# Patient Record
Sex: Female | Born: 1945 | Race: White | Hispanic: No | Smoking: Never smoker
Health system: Southern US, Community
[De-identification: ages and names within clinical notes are randomized; demographics above are authoritative.]

## PROBLEM LIST (undated history)

## (undated) DIAGNOSIS — N189 Chronic kidney disease, unspecified: Secondary | ICD-10-CM

## (undated) DIAGNOSIS — I10 Essential (primary) hypertension: Secondary | ICD-10-CM

## (undated) DIAGNOSIS — D649 Anemia, unspecified: Secondary | ICD-10-CM

## (undated) DIAGNOSIS — M1990 Unspecified osteoarthritis, unspecified site: Secondary | ICD-10-CM

## (undated) DIAGNOSIS — R002 Palpitations: Secondary | ICD-10-CM

## (undated) DIAGNOSIS — E7800 Pure hypercholesterolemia, unspecified: Secondary | ICD-10-CM

## (undated) DIAGNOSIS — E785 Hyperlipidemia, unspecified: Secondary | ICD-10-CM

## (undated) DIAGNOSIS — F419 Anxiety disorder, unspecified: Secondary | ICD-10-CM

## (undated) DIAGNOSIS — M722 Plantar fascial fibromatosis: Secondary | ICD-10-CM

## (undated) HISTORY — PX: APPENDECTOMY: SHX54

## (undated) HISTORY — DX: Pure hypercholesterolemia, unspecified: E78.00

## (undated) HISTORY — DX: Chronic kidney disease, unspecified: N18.9

## (undated) HISTORY — PX: CHOLECYSTECTOMY: SHX55

## (undated) HISTORY — PX: COLONOSCOPY: SHX174

## (undated) HISTORY — PX: ROTATOR CUFF REPAIR: SHX139

## (undated) HISTORY — DX: Plantar fascial fibromatosis: M72.2

## (undated) HISTORY — PX: ABDOMINAL HYSTERECTOMY: SHX81

## (undated) HISTORY — PX: CATARACT EXTRACTION, BILATERAL: SHX1313

## (undated) HISTORY — DX: Palpitations: R00.2

## (undated) HISTORY — PX: KNEE ARTHROSCOPY W/ MENISCECTOMY: SHX1879

## (undated) HISTORY — PX: TONSILLECTOMY: SUR1361

## (undated) HISTORY — PX: ROBOTIC ASSISTED SUPRACERVICAL HYSTERECTOMY WITH BILATERAL SALPINGO OOPHERECTOMY: SHX6084

## (undated) HISTORY — PX: NASAL SEPTUM SURGERY: SHX37

---

## 1998-04-19 ENCOUNTER — Encounter: Payer: Self-pay | Admitting: Family Medicine

## 1998-04-19 ENCOUNTER — Ambulatory Visit (HOSPITAL_COMMUNITY): Admission: RE | Admit: 1998-04-19 | Discharge: 1998-04-19 | Payer: Self-pay | Admitting: Family Medicine

## 1999-08-17 ENCOUNTER — Ambulatory Visit (HOSPITAL_COMMUNITY): Admission: RE | Admit: 1999-08-17 | Discharge: 1999-08-17 | Payer: Self-pay | Admitting: Family Medicine

## 1999-08-17 ENCOUNTER — Encounter: Payer: Self-pay | Admitting: Family Medicine

## 2000-11-25 ENCOUNTER — Encounter: Payer: Self-pay | Admitting: Family Medicine

## 2000-11-25 ENCOUNTER — Ambulatory Visit (HOSPITAL_COMMUNITY): Admission: RE | Admit: 2000-11-25 | Discharge: 2000-11-25 | Payer: Self-pay | Admitting: Family Medicine

## 2002-12-24 ENCOUNTER — Ambulatory Visit (HOSPITAL_COMMUNITY): Admission: RE | Admit: 2002-12-24 | Discharge: 2002-12-24 | Payer: Self-pay | Admitting: Family Medicine

## 2002-12-24 ENCOUNTER — Encounter: Payer: Self-pay | Admitting: Family Medicine

## 2002-12-31 ENCOUNTER — Encounter: Admission: RE | Admit: 2002-12-31 | Discharge: 2002-12-31 | Payer: Self-pay | Admitting: Family Medicine

## 2002-12-31 ENCOUNTER — Encounter: Payer: Self-pay | Admitting: Family Medicine

## 2004-01-23 ENCOUNTER — Other Ambulatory Visit: Admission: RE | Admit: 2004-01-23 | Discharge: 2004-01-23 | Payer: Self-pay | Admitting: Family Medicine

## 2007-11-23 ENCOUNTER — Ambulatory Visit (HOSPITAL_COMMUNITY): Admission: RE | Admit: 2007-11-23 | Discharge: 2007-11-23 | Payer: Self-pay | Admitting: Family Medicine

## 2008-07-22 ENCOUNTER — Other Ambulatory Visit: Admission: RE | Admit: 2008-07-22 | Discharge: 2008-07-22 | Payer: Self-pay | Admitting: Family Medicine

## 2009-08-01 ENCOUNTER — Ambulatory Visit (HOSPITAL_COMMUNITY): Admission: RE | Admit: 2009-08-01 | Discharge: 2009-08-01 | Payer: Self-pay | Admitting: Family Medicine

## 2009-08-07 ENCOUNTER — Encounter: Admission: RE | Admit: 2009-08-07 | Discharge: 2009-08-07 | Payer: Self-pay | Admitting: Family Medicine

## 2010-06-03 ENCOUNTER — Encounter: Payer: Self-pay | Admitting: Family Medicine

## 2011-05-15 DIAGNOSIS — H2510 Age-related nuclear cataract, unspecified eye: Secondary | ICD-10-CM | POA: Diagnosis not present

## 2011-05-15 DIAGNOSIS — H40029 Open angle with borderline findings, high risk, unspecified eye: Secondary | ICD-10-CM | POA: Diagnosis not present

## 2011-05-15 DIAGNOSIS — H524 Presbyopia: Secondary | ICD-10-CM | POA: Diagnosis not present

## 2011-06-11 ENCOUNTER — Other Ambulatory Visit: Payer: Self-pay | Admitting: Family Medicine

## 2011-06-11 DIAGNOSIS — N644 Mastodynia: Secondary | ICD-10-CM

## 2011-06-19 ENCOUNTER — Ambulatory Visit
Admission: RE | Admit: 2011-06-19 | Discharge: 2011-06-19 | Disposition: A | Discharge status: Home / Self Care | Payer: Medicare Other | Source: Ambulatory Visit | Attending: Family Medicine | Admitting: Family Medicine

## 2011-06-19 DIAGNOSIS — R928 Other abnormal and inconclusive findings on diagnostic imaging of breast: Secondary | ICD-10-CM | POA: Diagnosis not present

## 2011-06-19 DIAGNOSIS — N644 Mastodynia: Secondary | ICD-10-CM

## 2011-07-17 DIAGNOSIS — N951 Menopausal and female climacteric states: Secondary | ICD-10-CM | POA: Diagnosis not present

## 2011-07-17 DIAGNOSIS — F411 Generalized anxiety disorder: Secondary | ICD-10-CM | POA: Diagnosis not present

## 2011-07-17 DIAGNOSIS — IMO0001 Reserved for inherently not codable concepts without codable children: Secondary | ICD-10-CM | POA: Diagnosis not present

## 2011-07-17 DIAGNOSIS — E785 Hyperlipidemia, unspecified: Secondary | ICD-10-CM | POA: Diagnosis not present

## 2011-07-17 DIAGNOSIS — E559 Vitamin D deficiency, unspecified: Secondary | ICD-10-CM | POA: Diagnosis not present

## 2011-07-17 DIAGNOSIS — I10 Essential (primary) hypertension: Secondary | ICD-10-CM | POA: Diagnosis not present

## 2012-01-21 DIAGNOSIS — Z23 Encounter for immunization: Secondary | ICD-10-CM | POA: Diagnosis not present

## 2012-01-21 DIAGNOSIS — E785 Hyperlipidemia, unspecified: Secondary | ICD-10-CM | POA: Diagnosis not present

## 2012-01-21 DIAGNOSIS — F411 Generalized anxiety disorder: Secondary | ICD-10-CM | POA: Diagnosis not present

## 2012-01-21 DIAGNOSIS — R21 Rash and other nonspecific skin eruption: Secondary | ICD-10-CM | POA: Diagnosis not present

## 2012-01-21 DIAGNOSIS — I10 Essential (primary) hypertension: Secondary | ICD-10-CM | POA: Diagnosis not present

## 2012-01-21 DIAGNOSIS — B079 Viral wart, unspecified: Secondary | ICD-10-CM | POA: Diagnosis not present

## 2012-01-21 DIAGNOSIS — Z0000 Encounter for general adult medical examination without abnormal findings: Secondary | ICD-10-CM | POA: Diagnosis not present

## 2012-01-24 DIAGNOSIS — M25519 Pain in unspecified shoulder: Secondary | ICD-10-CM | POA: Diagnosis not present

## 2012-01-31 DIAGNOSIS — M19019 Primary osteoarthritis, unspecified shoulder: Secondary | ICD-10-CM | POA: Diagnosis not present

## 2012-02-03 DIAGNOSIS — M19019 Primary osteoarthritis, unspecified shoulder: Secondary | ICD-10-CM | POA: Diagnosis not present

## 2012-02-12 DIAGNOSIS — M24119 Other articular cartilage disorders, unspecified shoulder: Secondary | ICD-10-CM | POA: Diagnosis not present

## 2012-02-12 DIAGNOSIS — S43429A Sprain of unspecified rotator cuff capsule, initial encounter: Secondary | ICD-10-CM | POA: Diagnosis not present

## 2012-02-12 DIAGNOSIS — M67919 Unspecified disorder of synovium and tendon, unspecified shoulder: Secondary | ICD-10-CM | POA: Diagnosis not present

## 2012-02-12 DIAGNOSIS — M19019 Primary osteoarthritis, unspecified shoulder: Secondary | ICD-10-CM | POA: Diagnosis not present

## 2012-02-19 DIAGNOSIS — M25519 Pain in unspecified shoulder: Secondary | ICD-10-CM | POA: Diagnosis not present

## 2012-02-26 DIAGNOSIS — M25519 Pain in unspecified shoulder: Secondary | ICD-10-CM | POA: Diagnosis not present

## 2012-02-28 DIAGNOSIS — M25519 Pain in unspecified shoulder: Secondary | ICD-10-CM | POA: Diagnosis not present

## 2012-03-03 DIAGNOSIS — M25519 Pain in unspecified shoulder: Secondary | ICD-10-CM | POA: Diagnosis not present

## 2012-03-06 DIAGNOSIS — M25579 Pain in unspecified ankle and joints of unspecified foot: Secondary | ICD-10-CM | POA: Diagnosis not present

## 2012-03-06 DIAGNOSIS — M25519 Pain in unspecified shoulder: Secondary | ICD-10-CM | POA: Diagnosis not present

## 2012-03-10 DIAGNOSIS — M25519 Pain in unspecified shoulder: Secondary | ICD-10-CM | POA: Diagnosis not present

## 2012-03-10 DIAGNOSIS — M25579 Pain in unspecified ankle and joints of unspecified foot: Secondary | ICD-10-CM | POA: Diagnosis not present

## 2012-03-13 DIAGNOSIS — M25579 Pain in unspecified ankle and joints of unspecified foot: Secondary | ICD-10-CM | POA: Diagnosis not present

## 2012-03-13 DIAGNOSIS — M25519 Pain in unspecified shoulder: Secondary | ICD-10-CM | POA: Diagnosis not present

## 2012-03-17 DIAGNOSIS — M25579 Pain in unspecified ankle and joints of unspecified foot: Secondary | ICD-10-CM | POA: Diagnosis not present

## 2012-03-17 DIAGNOSIS — M25519 Pain in unspecified shoulder: Secondary | ICD-10-CM | POA: Diagnosis not present

## 2012-03-20 DIAGNOSIS — M25519 Pain in unspecified shoulder: Secondary | ICD-10-CM | POA: Diagnosis not present

## 2012-03-20 DIAGNOSIS — M25579 Pain in unspecified ankle and joints of unspecified foot: Secondary | ICD-10-CM | POA: Diagnosis not present

## 2012-03-23 DIAGNOSIS — M25519 Pain in unspecified shoulder: Secondary | ICD-10-CM | POA: Diagnosis not present

## 2012-03-23 DIAGNOSIS — M25579 Pain in unspecified ankle and joints of unspecified foot: Secondary | ICD-10-CM | POA: Diagnosis not present

## 2012-03-25 DIAGNOSIS — M25519 Pain in unspecified shoulder: Secondary | ICD-10-CM | POA: Diagnosis not present

## 2012-03-31 DIAGNOSIS — M25519 Pain in unspecified shoulder: Secondary | ICD-10-CM | POA: Diagnosis not present

## 2012-03-31 DIAGNOSIS — M25579 Pain in unspecified ankle and joints of unspecified foot: Secondary | ICD-10-CM | POA: Diagnosis not present

## 2012-04-03 DIAGNOSIS — M25519 Pain in unspecified shoulder: Secondary | ICD-10-CM | POA: Diagnosis not present

## 2012-04-06 DIAGNOSIS — M25579 Pain in unspecified ankle and joints of unspecified foot: Secondary | ICD-10-CM | POA: Diagnosis not present

## 2012-04-06 DIAGNOSIS — M25519 Pain in unspecified shoulder: Secondary | ICD-10-CM | POA: Diagnosis not present

## 2012-04-08 DIAGNOSIS — M25519 Pain in unspecified shoulder: Secondary | ICD-10-CM | POA: Diagnosis not present

## 2012-04-08 DIAGNOSIS — M25579 Pain in unspecified ankle and joints of unspecified foot: Secondary | ICD-10-CM | POA: Diagnosis not present

## 2012-04-17 DIAGNOSIS — M25579 Pain in unspecified ankle and joints of unspecified foot: Secondary | ICD-10-CM | POA: Diagnosis not present

## 2012-04-17 DIAGNOSIS — M25519 Pain in unspecified shoulder: Secondary | ICD-10-CM | POA: Diagnosis not present

## 2012-04-20 DIAGNOSIS — M25519 Pain in unspecified shoulder: Secondary | ICD-10-CM | POA: Diagnosis not present

## 2012-04-20 DIAGNOSIS — M25579 Pain in unspecified ankle and joints of unspecified foot: Secondary | ICD-10-CM | POA: Diagnosis not present

## 2012-04-29 DIAGNOSIS — M25519 Pain in unspecified shoulder: Secondary | ICD-10-CM | POA: Diagnosis not present

## 2012-04-30 DIAGNOSIS — M25519 Pain in unspecified shoulder: Secondary | ICD-10-CM | POA: Diagnosis not present

## 2012-05-04 DIAGNOSIS — M25519 Pain in unspecified shoulder: Secondary | ICD-10-CM | POA: Diagnosis not present

## 2012-05-04 DIAGNOSIS — M25579 Pain in unspecified ankle and joints of unspecified foot: Secondary | ICD-10-CM | POA: Diagnosis not present

## 2012-05-12 DIAGNOSIS — M25519 Pain in unspecified shoulder: Secondary | ICD-10-CM | POA: Diagnosis not present

## 2012-05-15 DIAGNOSIS — M25579 Pain in unspecified ankle and joints of unspecified foot: Secondary | ICD-10-CM | POA: Diagnosis not present

## 2012-05-15 DIAGNOSIS — M25519 Pain in unspecified shoulder: Secondary | ICD-10-CM | POA: Diagnosis not present

## 2012-06-02 DIAGNOSIS — Z4889 Encounter for other specified surgical aftercare: Secondary | ICD-10-CM | POA: Diagnosis not present

## 2012-06-02 DIAGNOSIS — M25519 Pain in unspecified shoulder: Secondary | ICD-10-CM | POA: Diagnosis not present

## 2012-07-06 DIAGNOSIS — H268 Other specified cataract: Secondary | ICD-10-CM | POA: Diagnosis not present

## 2012-07-06 DIAGNOSIS — H40019 Open angle with borderline findings, low risk, unspecified eye: Secondary | ICD-10-CM | POA: Diagnosis not present

## 2012-07-22 DIAGNOSIS — J0100 Acute maxillary sinusitis, unspecified: Secondary | ICD-10-CM | POA: Diagnosis not present

## 2012-10-21 DIAGNOSIS — R413 Other amnesia: Secondary | ICD-10-CM | POA: Diagnosis not present

## 2012-10-22 DIAGNOSIS — R413 Other amnesia: Secondary | ICD-10-CM | POA: Diagnosis not present

## 2012-10-22 DIAGNOSIS — R51 Headache: Secondary | ICD-10-CM | POA: Diagnosis not present

## 2012-11-18 DIAGNOSIS — H40019 Open angle with borderline findings, low risk, unspecified eye: Secondary | ICD-10-CM | POA: Diagnosis not present

## 2013-03-02 DIAGNOSIS — M545 Low back pain: Secondary | ICD-10-CM | POA: Diagnosis not present

## 2013-03-02 DIAGNOSIS — I10 Essential (primary) hypertension: Secondary | ICD-10-CM | POA: Diagnosis not present

## 2013-03-02 DIAGNOSIS — E785 Hyperlipidemia, unspecified: Secondary | ICD-10-CM | POA: Diagnosis not present

## 2013-03-02 DIAGNOSIS — M25473 Effusion, unspecified ankle: Secondary | ICD-10-CM | POA: Diagnosis not present

## 2013-03-02 DIAGNOSIS — Z0000 Encounter for general adult medical examination without abnormal findings: Secondary | ICD-10-CM | POA: Diagnosis not present

## 2013-03-02 DIAGNOSIS — F411 Generalized anxiety disorder: Secondary | ICD-10-CM | POA: Diagnosis not present

## 2013-03-09 ENCOUNTER — Other Ambulatory Visit: Payer: Self-pay | Admitting: Family Medicine

## 2013-03-09 ENCOUNTER — Ambulatory Visit
Admission: RE | Admit: 2013-03-09 | Discharge: 2013-03-09 | Disposition: A | Discharge status: Home / Self Care | Payer: Medicare Other | Source: Ambulatory Visit | Attending: Family Medicine | Admitting: Family Medicine

## 2013-03-09 DIAGNOSIS — M545 Low back pain: Secondary | ICD-10-CM

## 2013-03-09 DIAGNOSIS — M549 Dorsalgia, unspecified: Secondary | ICD-10-CM | POA: Diagnosis not present

## 2013-03-09 DIAGNOSIS — M47817 Spondylosis without myelopathy or radiculopathy, lumbosacral region: Secondary | ICD-10-CM | POA: Diagnosis not present

## 2013-03-10 DIAGNOSIS — M1710 Unilateral primary osteoarthritis, unspecified knee: Secondary | ICD-10-CM | POA: Diagnosis not present

## 2013-03-10 DIAGNOSIS — R229 Localized swelling, mass and lump, unspecified: Secondary | ICD-10-CM | POA: Diagnosis not present

## 2013-03-10 DIAGNOSIS — M25579 Pain in unspecified ankle and joints of unspecified foot: Secondary | ICD-10-CM | POA: Diagnosis not present

## 2013-03-29 DIAGNOSIS — M25579 Pain in unspecified ankle and joints of unspecified foot: Secondary | ICD-10-CM | POA: Diagnosis not present

## 2013-03-29 DIAGNOSIS — M545 Low back pain: Secondary | ICD-10-CM | POA: Diagnosis not present

## 2013-03-31 DIAGNOSIS — M25579 Pain in unspecified ankle and joints of unspecified foot: Secondary | ICD-10-CM | POA: Diagnosis not present

## 2013-03-31 DIAGNOSIS — M545 Low back pain: Secondary | ICD-10-CM | POA: Diagnosis not present

## 2013-04-05 DIAGNOSIS — M545 Low back pain: Secondary | ICD-10-CM | POA: Diagnosis not present

## 2013-04-05 DIAGNOSIS — M25579 Pain in unspecified ankle and joints of unspecified foot: Secondary | ICD-10-CM | POA: Diagnosis not present

## 2013-04-07 DIAGNOSIS — M25579 Pain in unspecified ankle and joints of unspecified foot: Secondary | ICD-10-CM | POA: Diagnosis not present

## 2013-04-07 DIAGNOSIS — M545 Low back pain: Secondary | ICD-10-CM | POA: Diagnosis not present

## 2013-04-13 DIAGNOSIS — Z23 Encounter for immunization: Secondary | ICD-10-CM | POA: Diagnosis not present

## 2013-04-13 DIAGNOSIS — M545 Low back pain: Secondary | ICD-10-CM | POA: Diagnosis not present

## 2013-04-13 DIAGNOSIS — M25579 Pain in unspecified ankle and joints of unspecified foot: Secondary | ICD-10-CM | POA: Diagnosis not present

## 2013-04-16 DIAGNOSIS — M25579 Pain in unspecified ankle and joints of unspecified foot: Secondary | ICD-10-CM | POA: Diagnosis not present

## 2013-04-16 DIAGNOSIS — M545 Low back pain: Secondary | ICD-10-CM | POA: Diagnosis not present

## 2013-04-20 DIAGNOSIS — M545 Low back pain: Secondary | ICD-10-CM | POA: Diagnosis not present

## 2013-04-20 DIAGNOSIS — M25579 Pain in unspecified ankle and joints of unspecified foot: Secondary | ICD-10-CM | POA: Diagnosis not present

## 2013-04-23 DIAGNOSIS — M545 Low back pain: Secondary | ICD-10-CM | POA: Diagnosis not present

## 2013-04-23 DIAGNOSIS — M25579 Pain in unspecified ankle and joints of unspecified foot: Secondary | ICD-10-CM | POA: Diagnosis not present

## 2013-04-27 DIAGNOSIS — M25579 Pain in unspecified ankle and joints of unspecified foot: Secondary | ICD-10-CM | POA: Diagnosis not present

## 2013-04-29 DIAGNOSIS — M25579 Pain in unspecified ankle and joints of unspecified foot: Secondary | ICD-10-CM | POA: Diagnosis not present

## 2013-05-03 DIAGNOSIS — M25579 Pain in unspecified ankle and joints of unspecified foot: Secondary | ICD-10-CM | POA: Diagnosis not present

## 2013-05-05 DIAGNOSIS — M25579 Pain in unspecified ankle and joints of unspecified foot: Secondary | ICD-10-CM | POA: Diagnosis not present

## 2013-05-11 DIAGNOSIS — M25579 Pain in unspecified ankle and joints of unspecified foot: Secondary | ICD-10-CM | POA: Diagnosis not present

## 2013-05-14 DIAGNOSIS — M25579 Pain in unspecified ankle and joints of unspecified foot: Secondary | ICD-10-CM | POA: Diagnosis not present

## 2013-05-17 DIAGNOSIS — M25579 Pain in unspecified ankle and joints of unspecified foot: Secondary | ICD-10-CM | POA: Diagnosis not present

## 2013-05-20 DIAGNOSIS — M25579 Pain in unspecified ankle and joints of unspecified foot: Secondary | ICD-10-CM | POA: Diagnosis not present

## 2013-05-26 DIAGNOSIS — M25579 Pain in unspecified ankle and joints of unspecified foot: Secondary | ICD-10-CM | POA: Diagnosis not present

## 2013-06-10 DIAGNOSIS — M25579 Pain in unspecified ankle and joints of unspecified foot: Secondary | ICD-10-CM | POA: Diagnosis not present

## 2013-08-02 DIAGNOSIS — Z86010 Personal history of colonic polyps: Secondary | ICD-10-CM | POA: Diagnosis not present

## 2013-08-02 DIAGNOSIS — D126 Benign neoplasm of colon, unspecified: Secondary | ICD-10-CM | POA: Diagnosis not present

## 2013-08-02 DIAGNOSIS — K5730 Diverticulosis of large intestine without perforation or abscess without bleeding: Secondary | ICD-10-CM | POA: Diagnosis not present

## 2013-08-02 DIAGNOSIS — Z09 Encounter for follow-up examination after completed treatment for conditions other than malignant neoplasm: Secondary | ICD-10-CM | POA: Diagnosis not present

## 2013-08-19 DIAGNOSIS — H268 Other specified cataract: Secondary | ICD-10-CM | POA: Diagnosis not present

## 2013-08-19 DIAGNOSIS — H40019 Open angle with borderline findings, low risk, unspecified eye: Secondary | ICD-10-CM | POA: Diagnosis not present

## 2014-02-22 ENCOUNTER — Other Ambulatory Visit: Payer: Self-pay

## 2014-02-22 DIAGNOSIS — E6609 Other obesity due to excess calories: Secondary | ICD-10-CM | POA: Diagnosis not present

## 2014-02-22 DIAGNOSIS — Z1231 Encounter for screening mammogram for malignant neoplasm of breast: Secondary | ICD-10-CM

## 2014-02-22 DIAGNOSIS — I10 Essential (primary) hypertension: Secondary | ICD-10-CM | POA: Diagnosis not present

## 2014-02-22 DIAGNOSIS — F411 Generalized anxiety disorder: Secondary | ICD-10-CM | POA: Diagnosis not present

## 2014-02-22 DIAGNOSIS — L309 Dermatitis, unspecified: Secondary | ICD-10-CM | POA: Diagnosis not present

## 2014-02-22 DIAGNOSIS — E785 Hyperlipidemia, unspecified: Secondary | ICD-10-CM | POA: Diagnosis not present

## 2014-02-22 DIAGNOSIS — Z23 Encounter for immunization: Secondary | ICD-10-CM | POA: Diagnosis not present

## 2014-03-15 ENCOUNTER — Ambulatory Visit
Admission: RE | Admit: 2014-03-15 | Discharge: 2014-03-15 | Disposition: A | Discharge status: Home / Self Care | Payer: Medicare Other | Source: Ambulatory Visit

## 2014-03-15 DIAGNOSIS — Z1231 Encounter for screening mammogram for malignant neoplasm of breast: Secondary | ICD-10-CM | POA: Diagnosis not present

## 2014-07-08 IMAGING — CR DG THORACIC SPINE 3V
3 series · 3 of 3 positions shown · non-contrast
Comparison: None.

CLINICAL DATA: Back pain

EXAM:
THORACIC SPINE - 2 VIEW + SWIMMERS

[view not recorded (1 of 3)]
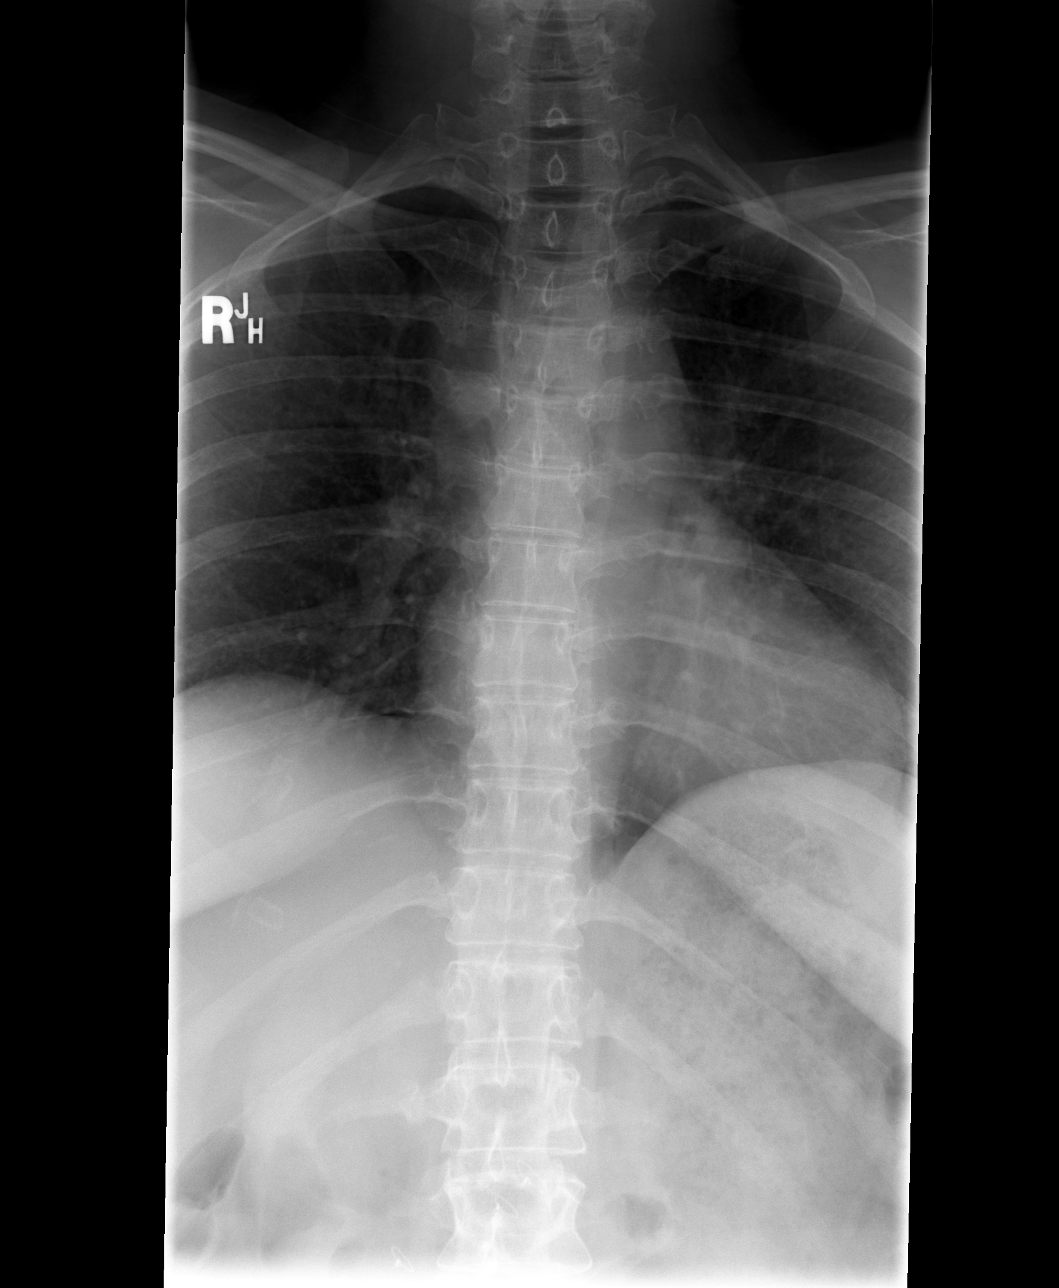

[view not recorded (2 of 3)]
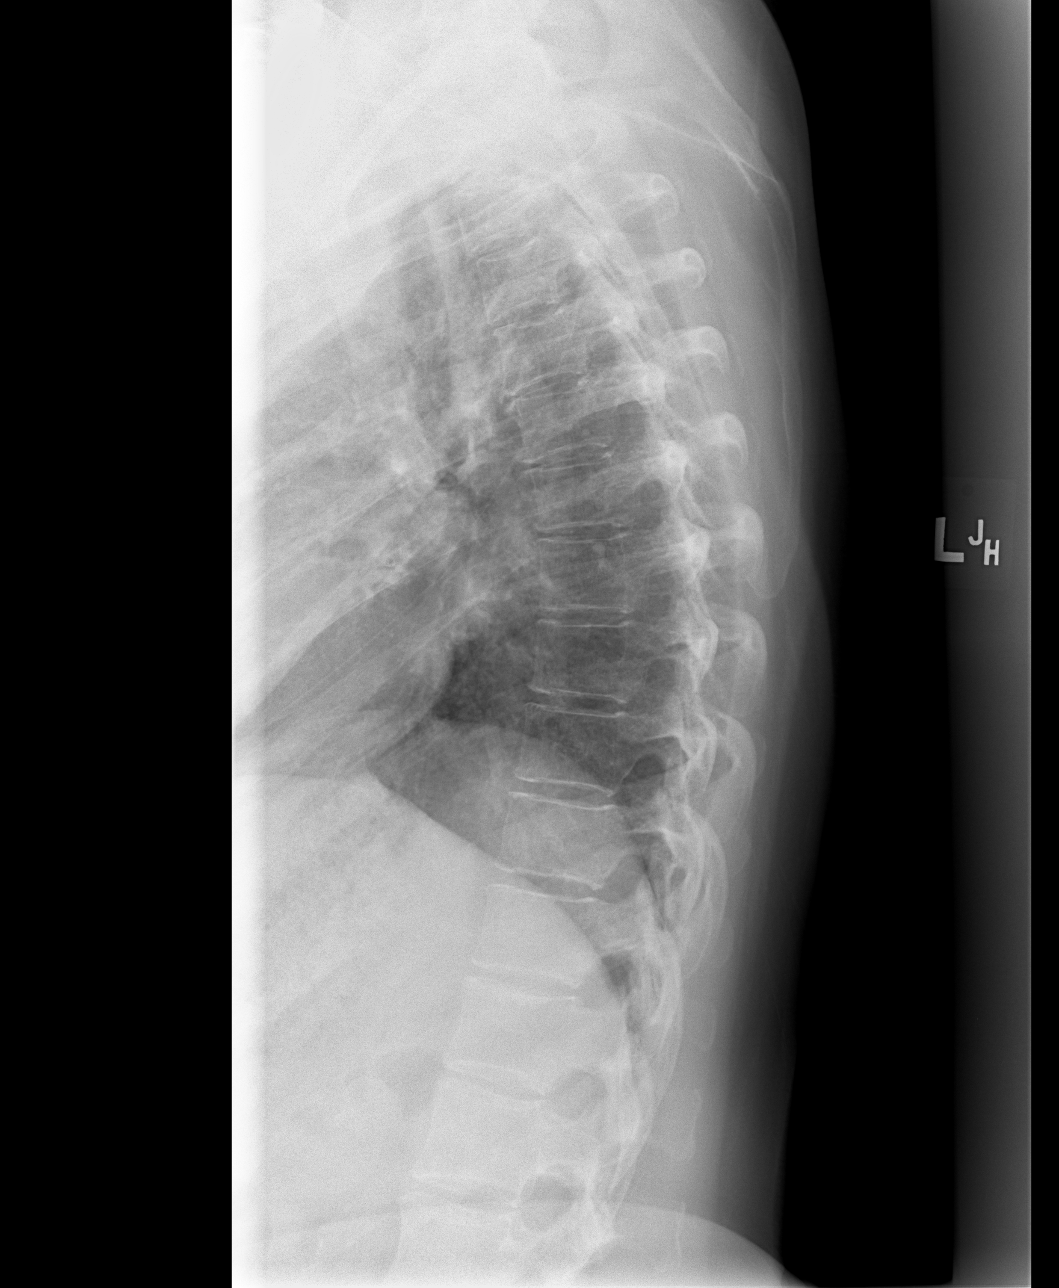

[view not recorded (3 of 3)]
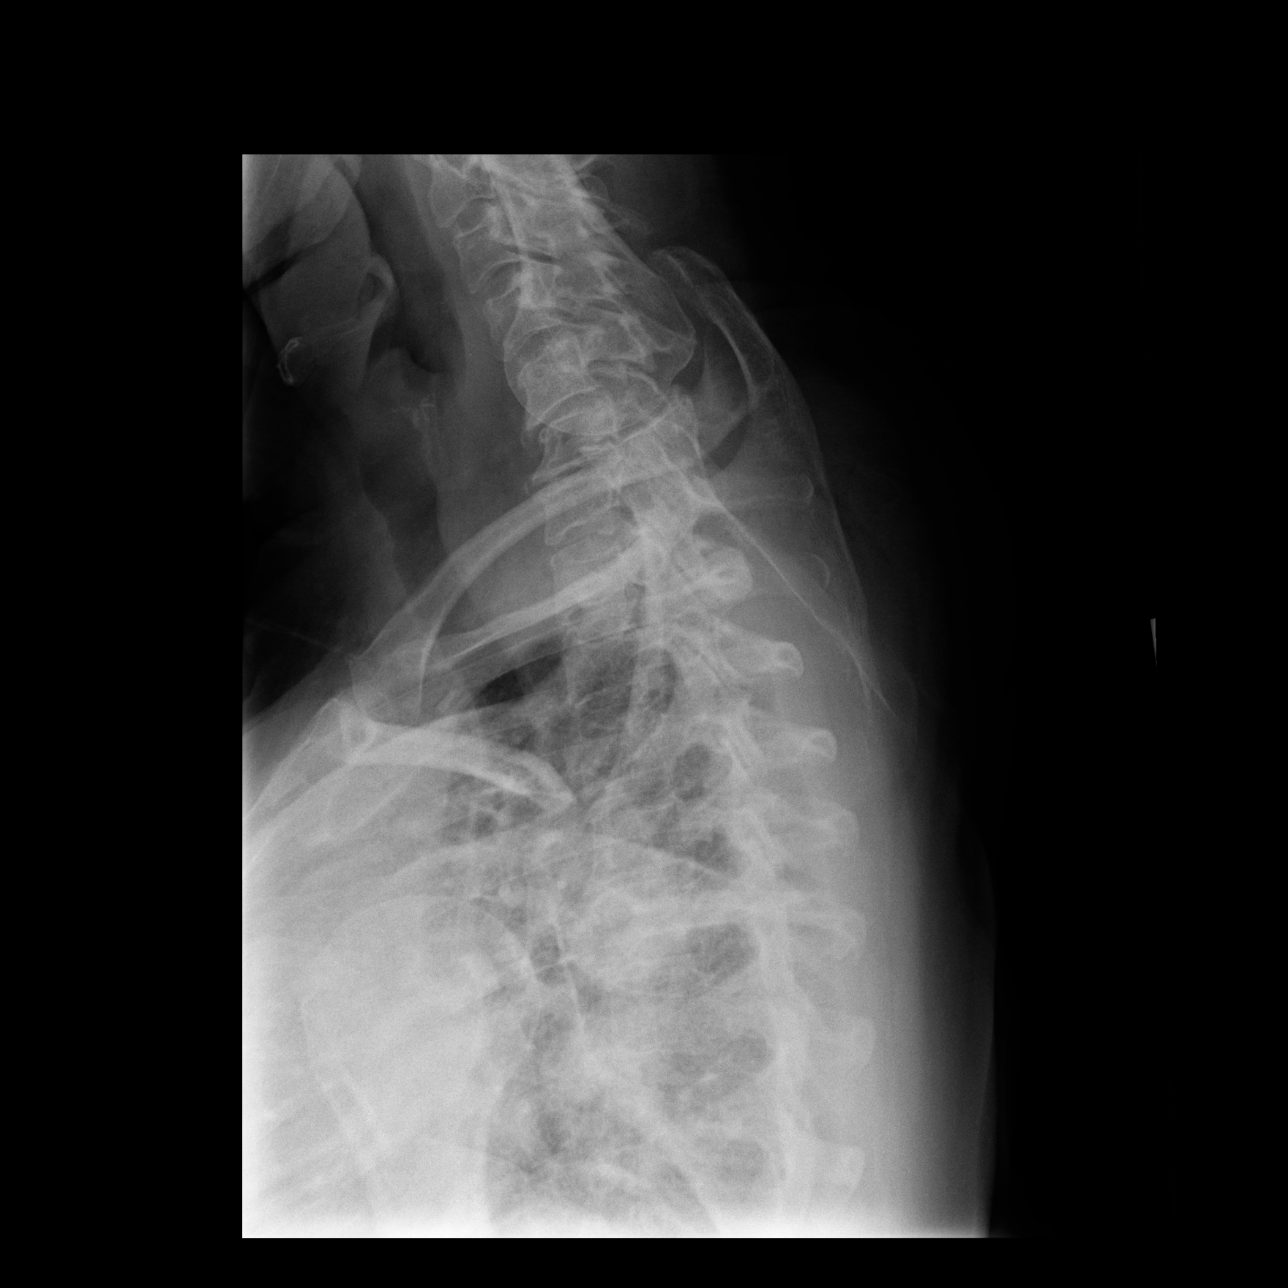

[3 of 3 positions shown; findings below may reference images not displayed]

FINDINGS: Mild osteophytic changes are noted. Vertebral body height is well
maintained. The pedicles are within normal limits and no paraspinal
mass lesion is seen. Degenerative change of the cervical spine is
again noted.
IMPRESSION: No acute abnormality seen.

## 2014-08-23 DIAGNOSIS — N951 Menopausal and female climacteric states: Secondary | ICD-10-CM | POA: Diagnosis not present

## 2014-08-23 DIAGNOSIS — I10 Essential (primary) hypertension: Secondary | ICD-10-CM | POA: Diagnosis not present

## 2014-08-23 DIAGNOSIS — Z23 Encounter for immunization: Secondary | ICD-10-CM | POA: Diagnosis not present

## 2014-08-23 DIAGNOSIS — F411 Generalized anxiety disorder: Secondary | ICD-10-CM | POA: Diagnosis not present

## 2014-08-23 DIAGNOSIS — Z0000 Encounter for general adult medical examination without abnormal findings: Secondary | ICD-10-CM | POA: Diagnosis not present

## 2014-08-23 DIAGNOSIS — E6609 Other obesity due to excess calories: Secondary | ICD-10-CM | POA: Diagnosis not present

## 2014-08-23 DIAGNOSIS — E785 Hyperlipidemia, unspecified: Secondary | ICD-10-CM | POA: Diagnosis not present

## 2014-09-06 DIAGNOSIS — H40011 Open angle with borderline findings, low risk, right eye: Secondary | ICD-10-CM | POA: Diagnosis not present

## 2014-09-06 DIAGNOSIS — H2513 Age-related nuclear cataract, bilateral: Secondary | ICD-10-CM | POA: Diagnosis not present

## 2014-09-06 DIAGNOSIS — H524 Presbyopia: Secondary | ICD-10-CM | POA: Diagnosis not present

## 2014-09-14 DIAGNOSIS — M8589 Other specified disorders of bone density and structure, multiple sites: Secondary | ICD-10-CM | POA: Diagnosis not present

## 2014-09-14 DIAGNOSIS — M859 Disorder of bone density and structure, unspecified: Secondary | ICD-10-CM | POA: Diagnosis not present

## 2015-02-24 DIAGNOSIS — I10 Essential (primary) hypertension: Secondary | ICD-10-CM | POA: Diagnosis not present

## 2015-02-24 DIAGNOSIS — M25551 Pain in right hip: Secondary | ICD-10-CM | POA: Diagnosis not present

## 2015-02-24 DIAGNOSIS — E785 Hyperlipidemia, unspecified: Secondary | ICD-10-CM | POA: Diagnosis not present

## 2015-02-24 DIAGNOSIS — Z23 Encounter for immunization: Secondary | ICD-10-CM | POA: Diagnosis not present

## 2015-02-24 DIAGNOSIS — L989 Disorder of the skin and subcutaneous tissue, unspecified: Secondary | ICD-10-CM | POA: Diagnosis not present

## 2015-07-10 DIAGNOSIS — J069 Acute upper respiratory infection, unspecified: Secondary | ICD-10-CM | POA: Diagnosis not present

## 2015-07-10 DIAGNOSIS — R05 Cough: Secondary | ICD-10-CM | POA: Diagnosis not present

## 2015-09-01 DIAGNOSIS — F4010 Social phobia, unspecified: Secondary | ICD-10-CM | POA: Diagnosis not present

## 2015-09-01 DIAGNOSIS — M25471 Effusion, right ankle: Secondary | ICD-10-CM | POA: Diagnosis not present

## 2015-09-01 DIAGNOSIS — I129 Hypertensive chronic kidney disease with stage 1 through stage 4 chronic kidney disease, or unspecified chronic kidney disease: Secondary | ICD-10-CM | POA: Diagnosis not present

## 2015-09-01 DIAGNOSIS — M25472 Effusion, left ankle: Secondary | ICD-10-CM | POA: Diagnosis not present

## 2015-09-01 DIAGNOSIS — N183 Chronic kidney disease, stage 3 (moderate): Secondary | ICD-10-CM | POA: Diagnosis not present

## 2015-09-01 DIAGNOSIS — E785 Hyperlipidemia, unspecified: Secondary | ICD-10-CM | POA: Diagnosis not present

## 2015-09-01 DIAGNOSIS — Z0000 Encounter for general adult medical examination without abnormal findings: Secondary | ICD-10-CM | POA: Diagnosis not present

## 2015-09-01 DIAGNOSIS — Z23 Encounter for immunization: Secondary | ICD-10-CM | POA: Diagnosis not present

## 2015-09-19 DIAGNOSIS — H40011 Open angle with borderline findings, low risk, right eye: Secondary | ICD-10-CM | POA: Diagnosis not present

## 2015-09-19 DIAGNOSIS — H2513 Age-related nuclear cataract, bilateral: Secondary | ICD-10-CM | POA: Diagnosis not present

## 2015-10-16 DIAGNOSIS — M25571 Pain in right ankle and joints of right foot: Secondary | ICD-10-CM | POA: Diagnosis not present

## 2015-10-16 DIAGNOSIS — M25551 Pain in right hip: Secondary | ICD-10-CM | POA: Diagnosis not present

## 2015-10-16 DIAGNOSIS — M25572 Pain in left ankle and joints of left foot: Secondary | ICD-10-CM | POA: Diagnosis not present

## 2016-03-05 DIAGNOSIS — N183 Chronic kidney disease, stage 3 (moderate): Secondary | ICD-10-CM | POA: Diagnosis not present

## 2016-03-05 DIAGNOSIS — E6609 Other obesity due to excess calories: Secondary | ICD-10-CM | POA: Diagnosis not present

## 2016-03-05 DIAGNOSIS — I10 Essential (primary) hypertension: Secondary | ICD-10-CM | POA: Diagnosis not present

## 2016-03-05 DIAGNOSIS — E785 Hyperlipidemia, unspecified: Secondary | ICD-10-CM | POA: Diagnosis not present

## 2016-03-05 DIAGNOSIS — M461 Sacroiliitis, not elsewhere classified: Secondary | ICD-10-CM | POA: Diagnosis not present

## 2016-03-05 DIAGNOSIS — R21 Rash and other nonspecific skin eruption: Secondary | ICD-10-CM | POA: Diagnosis not present

## 2016-03-05 DIAGNOSIS — Z23 Encounter for immunization: Secondary | ICD-10-CM | POA: Diagnosis not present

## 2016-09-24 DIAGNOSIS — I129 Hypertensive chronic kidney disease with stage 1 through stage 4 chronic kidney disease, or unspecified chronic kidney disease: Secondary | ICD-10-CM | POA: Diagnosis not present

## 2016-09-24 DIAGNOSIS — N183 Chronic kidney disease, stage 3 (moderate): Secondary | ICD-10-CM | POA: Diagnosis not present

## 2016-09-24 DIAGNOSIS — R002 Palpitations: Secondary | ICD-10-CM | POA: Diagnosis not present

## 2016-09-24 DIAGNOSIS — E6609 Other obesity due to excess calories: Secondary | ICD-10-CM | POA: Diagnosis not present

## 2016-09-24 DIAGNOSIS — F4010 Social phobia, unspecified: Secondary | ICD-10-CM | POA: Diagnosis not present

## 2016-09-24 DIAGNOSIS — Z0000 Encounter for general adult medical examination without abnormal findings: Secondary | ICD-10-CM | POA: Diagnosis not present

## 2016-09-24 DIAGNOSIS — E785 Hyperlipidemia, unspecified: Secondary | ICD-10-CM | POA: Diagnosis not present

## 2016-09-24 DIAGNOSIS — R21 Rash and other nonspecific skin eruption: Secondary | ICD-10-CM | POA: Diagnosis not present

## 2016-10-01 ENCOUNTER — Emergency Department (HOSPITAL_COMMUNITY)
Admission: EM | Admit: 2016-10-01 | Discharge: 2016-10-01 | Disposition: A | Discharge status: Home / Self Care | Payer: Medicare Other | Attending: Emergency Medicine | Admitting: Emergency Medicine

## 2016-10-01 ENCOUNTER — Encounter (HOSPITAL_COMMUNITY): Payer: Self-pay | Admitting: Emergency Medicine

## 2016-10-01 ENCOUNTER — Telehealth: Payer: Self-pay

## 2016-10-01 DIAGNOSIS — I10 Essential (primary) hypertension: Secondary | ICD-10-CM | POA: Diagnosis not present

## 2016-10-01 DIAGNOSIS — Y929 Unspecified place or not applicable: Secondary | ICD-10-CM | POA: Diagnosis not present

## 2016-10-01 DIAGNOSIS — Z7982 Long term (current) use of aspirin: Secondary | ICD-10-CM | POA: Diagnosis not present

## 2016-10-01 DIAGNOSIS — Z79899 Other long term (current) drug therapy: Secondary | ICD-10-CM | POA: Insufficient documentation

## 2016-10-01 DIAGNOSIS — Y939 Activity, unspecified: Secondary | ICD-10-CM | POA: Insufficient documentation

## 2016-10-01 DIAGNOSIS — Y999 Unspecified external cause status: Secondary | ICD-10-CM | POA: Diagnosis not present

## 2016-10-01 DIAGNOSIS — X58XXXA Exposure to other specified factors, initial encounter: Secondary | ICD-10-CM | POA: Insufficient documentation

## 2016-10-01 DIAGNOSIS — S0501XA Injury of conjunctiva and corneal abrasion without foreign body, right eye, initial encounter: Secondary | ICD-10-CM | POA: Diagnosis not present

## 2016-10-01 DIAGNOSIS — S0591XA Unspecified injury of right eye and orbit, initial encounter: Secondary | ICD-10-CM | POA: Diagnosis present

## 2016-10-01 HISTORY — DX: Essential (primary) hypertension: I10

## 2016-10-01 HISTORY — DX: Hyperlipidemia, unspecified: E78.5

## 2016-10-01 MED ORDER — TOBRAMYCIN 0.3 % OP SOLN: 1.0000 [drp] | OPHTHALMIC | 0 refills | Status: DC

## 2016-10-01 MED ORDER — FLUORESCEIN SODIUM 0.6 MG OP STRP
1.0000 | ORAL_STRIP | Freq: Once | OPHTHALMIC | Status: AC
  Administered 2016-10-01: 1 via OPHTHALMIC
  Filled 2016-10-01: qty 1

## 2016-10-01 NOTE — ED Triage Notes (Addendum)
Pt c/o right eye discomfort, marked subconjunctival bleeding to right lateral eye onset today after accidentally excoriating eye with her finger nail. No vision changes or pain. No anticoagulants, on aspirin.

## 2016-10-01 NOTE — Telephone Encounter (Signed)
SENT NOTES TO SCHEDULING 

## 2016-10-01 NOTE — ED Notes (Signed)
Patient is resting comfortably w/ice pack to RT eye.  Friend at bedside.

## 2016-10-01 NOTE — Discharge Instructions (Signed)
Follow up with your eye md in 2-3 days

## 2016-10-01 NOTE — ED Provider Notes (Signed)
[facility] DEPT Provider Note   CSN: [id number] Arrival date & time: 10/01/16  0702     History   Chief Complaint Chief Complaint  Patient presents with  . Eye Injury    HPI Ann Turner is a 71 y.o. female.  Patient states that she accidentally stuck her finger in her eye. She complains of some pain in her right eye   The history is provided by the patient.  Eye Injury  This is a new problem. The current episode started 3 to 5 hours ago. The problem occurs constantly. Pertinent negatives include no chest pain, no abdominal pain and no headaches. Nothing aggravates the symptoms. Nothing relieves the symptoms.    Past Medical History:  Diagnosis Date  . Hyperlipidemia   . Hypertension     There are no active problems to display for this patient.   Past Surgical History:  Procedure Laterality Date  . ABDOMINAL HYSTERECTOMY    . APPENDECTOMY    . CHOLECYSTECTOMY      OB History    No data available       Home Medications    Prior to Admission medications   Medication Sig Start Date End Date Taking? Authorizing Provider  aspirin EC 81 MG tablet Take 81 mg by mouth daily.   Yes [provider]  atorvastatin (LIPITOR) 10 MG tablet Take 10 mg by mouth daily. 09/24/16  Yes [provider]  lisinopril-hydrochlorothiazide (PRINZIDE,ZESTORETIC) 10-12.5 MG tablet Take 0.5 tablets by mouth daily. 08/04/16  Yes [provider]  traMADol (ULTRAM) 50 MG tablet Take 50 mg by mouth every 6 (six) hours as needed (pain).   Yes [provider]  tobramycin (TOBREX) 0.3 % ophthalmic solution Place 1 drop into the right eye every 4 (four) hours. 10/01/16   Milton Ferguson, MD    Family History No family history on file.  Social History Social History  Substance Use Topics  . Smoking status: Never Smoker  . Smokeless tobacco: Not on file  . Alcohol use No     Allergies   Patient has no known allergies.   Review of  Systems Review of Systems  Constitutional: Negative for appetite change and fatigue.  HENT: Negative for congestion, ear discharge and sinus pressure.        Mild eye pain  Eyes: Negative for discharge.  Respiratory: Negative for cough.   Cardiovascular: Negative for chest pain.  Gastrointestinal: Negative for abdominal pain and diarrhea.  Genitourinary: Negative for frequency and hematuria.  Musculoskeletal: Negative for back pain.  Skin: Negative for rash.  Neurological: Negative for seizures and headaches.  Psychiatric/Behavioral: Negative for hallucinations.     Physical Exam Updated Vital Signs BP (!) 174/72 (BP Location: Right Arm)   Pulse 95   Temp 98.2 F (36.8 C) (Oral)   Resp 18   SpO2 96%   Physical Exam  Constitutional: She is oriented to person, place, and time. She appears well-developed.  HENT:  Head: Normocephalic.  Eyes: EOM are normal. Pupils are equal, round, and reactive to light.  Patient has a conjunctival hemorrhage on the lateral right eye. Also for seen test was positive for small corneal abrasion at around 8:00 on the right eye.  Neck: No tracheal deviation present.  Cardiovascular:  No murmur heard. Musculoskeletal: Normal range of motion.  Neurological: She is oriented to person, place, and time.  Skin: Skin is warm.  Psychiatric: She has a normal mood and affect.     ED Treatments /  Results  Labs (all labs ordered are listed, but only abnormal results are displayed) Labs Reviewed - No data to display  EKG  EKG Interpretation None       Radiology No results found.  Procedures Procedures (including critical care time)  Medications Ordered in ED Medications  fluorescein ophthalmic strip 1 strip (not administered)     Initial Impression / Assessment and Plan / ED Course  I have reviewed the triage vital signs and the nursing notes.  Pertinent labs & imaging results that were available during my care of the patient were  reviewed by me and considered in my medical decision making (see chart for details).     Patient with conjunctival hemorrhage and abrasion to right eye. She will be put on tobramycin ophthalmologic drops. She will follow-up with her eye doctor in 2-3 days she will not wear her contacts or take her aspirin  Final Clinical Impressions(s) / ED Diagnoses   Final diagnoses:  Abrasion of right cornea, initial encounter    New Prescriptions New Prescriptions   TOBRAMYCIN (TOBREX) 0.3 % OPHTHALMIC SOLUTION    Place 1 drop into the right eye every 4 (four) hours.     Milton Ferguson, MD 10/01/16 [phone]

## 2016-10-04 ENCOUNTER — Telehealth: Payer: Self-pay | Admitting: Student

## 2016-10-04 NOTE — Telephone Encounter (Signed)
Received records from [facility] for appointment on 10/16/16 with Bernerd Pho, [place].  Records put with Brittany's schedule for 10/16/16. lp

## 2016-10-09 DIAGNOSIS — H2513 Age-related nuclear cataract, bilateral: Secondary | ICD-10-CM | POA: Diagnosis not present

## 2016-10-09 DIAGNOSIS — H40011 Open angle with borderline findings, low risk, right eye: Secondary | ICD-10-CM | POA: Diagnosis not present

## 2016-10-10 ENCOUNTER — Other Ambulatory Visit: Payer: Self-pay

## 2016-10-15 NOTE — Progress Notes (Signed)
Cardiology Office Note    Date:  10/16/2016   ID:  Ann Turner, Atha [birth year], MRN [id number]  PCP:  Harlan Stains, MD  Cardiologist: New to [facility] - Dr. Martinique   Chief Complaint  Patient presents with  . New Patient (Initial Visit)    palpitations    History of Present Illness:    Ann Turner is a 71 y.o. female with past medical history of HTN, HLD, and palpitations who presents to the office today for evaluation of palpitations at the request of Dr. Harlan Stains.  She was evaluated by her PCP on 09/24/2016 and reported episodes of a "sensation" along her chest occuring 6 weeks prior. She described this as a fluttering sensation with associated lightheadedness. Her episodes would last less than 5 minutes and spontaneously resolved. An EKG was obtained at that time which showed normal sinus rhythm, HR 88, with no acute ST or T-wave changes.  In talking with the patient today, she reports her episodes of palpitations occurred 6 weeks ago. Her symptoms occured for approximately one week and she describes this as a fluttering in her chest which would last for seconds up to 3 minutes at a time and mostly occur at rest. She would experience multiple episodes during the week which would spontaneously resolve. She notes associated dizziness but denies any chest discomfort, dyspnea, orthopnea, lower extremity edema, or presyncope. Following the week of palpitations, she denies any repeat episodes since.  She has been in her normal state of health and denies any recent illnesses or medication changes. Labs were checked by her PCP at the time of her appointment, including TSH, which was normal.  She has been push mowing her yard and gardening without any chest discomfort or dyspnea on exertion. She denies any prior anginal symptoms. No known CAD or prior MI's. No prior episodes of palpitations or known arrhythmias.   She has known HTN and HLD but denies any history of Type 2 DM. Her  mother had a CVA in her 60's but no known CAD in her parents or siblings. MGM had CAD. The patient denies any tobacco use, alcohol use, or recreational drug use. She does not consume caffeine.   Past Medical History:  Diagnosis Date  . Chronic kidney disease   . Hypercholesteremia   . Hyperlipidemia   . Hypertension   . Palpitations   . Plantar fasciitis     Past Surgical History:  Procedure Laterality Date  . ABDOMINAL HYSTERECTOMY    . APPENDECTOMY    . CHOLECYSTECTOMY    . CHOLECYSTECTOMY    . COLONOSCOPY    . NASAL SEPTUM SURGERY    . ROBOTIC ASSISTED SUPRACERVICAL HYSTERECTOMY WITH BILATERAL SALPINGO OOPHERECTOMY    . ROTATOR CUFF REPAIR    . TONSILLECTOMY      Current Medications: Outpatient Medications Prior to Visit  Medication Sig Dispense Refill  . aspirin EC 81 MG tablet Take 81 mg by mouth daily.    Marland Kitchen atorvastatin (LIPITOR) 10 MG tablet Take 10 mg by mouth daily.  3  . calcium carbonate (CALCIUM 600) 600 MG TABS tablet Take 600 mg by mouth daily with breakfast.    . Cholecalciferol 5000 units capsule Take 5,000 Units by mouth daily.    . fexofenadine (ALLEGRA) 180 MG tablet Take 180 mg by mouth daily.    Marland Kitchen lisinopril-hydrochlorothiazide (PRINZIDE,ZESTORETIC) 10-12.5 MG tablet Take 0.5 tablets by mouth daily.  0  . LORazepam (ATIVAN) 0.5 MG tablet TAKE 1/2 TO 1  TABLET BY MOUTH 3 TIMES A DAY AS NEEDED  0  . Multiple Vitamin (MULTIVITAMIN WITH MINERALS) TABS tablet Take 1 tablet by mouth daily.    Marland Kitchen triamcinolone cream (KENALOG) 0.1 % APPLY TO AFFECTED AREA TWICE A DAY AS NEEDED FOR UP TO 2 WEEKS  1  . erythromycin ophthalmic ointment APPLY 1 CM RIBBON INTO THE CONJUNCTIVAL SACS IN AFFECTED EYE(S) 3 TIMES A DAY  0  . tobramycin (TOBREX) 0.3 % ophthalmic solution Place 1 drop into the right eye every 4 (four) hours. (Patient not taking: Reported on 10/16/2016) 5 mL 0  . traMADol (ULTRAM) 50 MG tablet Take 50 mg by mouth every 6 (six) hours as needed (pain).     No  facility-administered medications prior to visit.      Allergies:   Pravastatin sodium; Simvastatin; and Zoloft [sertraline hcl]   Social History   Social History  . Marital status: Divorced    Spouse name: N/A  . Number of children: N/A  . Years of education: N/A   Social History Main Topics  . Smoking status: Never Smoker  . Smokeless tobacco: Never Used  . Alcohol use No  . Drug use: No  . Sexual activity: Not Asked   Other Topics Concern  . None   Social History Narrative  . None    Family History:  The patient's family history includes Breast cancer (age of onset: 74) in her sister; CAD (age of onset: 28) in her maternal grandfather and maternal grandmother; COPD in her father and paternal grandmother; CVA (age of onset: 94) in her mother; Coronary artery disease in her maternal grandfather and maternal grandmother; Hypercholesterolemia in her father, maternal grandfather, and mother; Hypertension in her maternal grandmother and mother; Non-Hodgkin's lymphoma in her sister.   Review of Systems:   Please see the history of present illness.     General:  No chills, fever, night sweats or weight changes.  Cardiovascular:  No chest pain, dyspnea on exertion, edema, orthopnea, paroxysmal nocturnal dyspnea. Positive for palpitations (now resolved).  Dermatological: No rash, lesions/masses Respiratory: No cough, dyspnea Urologic: No hematuria, dysuria Abdominal:   No nausea, vomiting, diarrhea, bright red blood per rectum, melena, or hematemesis Neurologic:  No visual changes, wkns, changes in mental status.  All other systems reviewed and are otherwise negative except as noted above.   Physical Exam:    VS:  BP 126/76   Pulse 88   Ht 5\' 3"  (1.6 m)   Wt 201 lb (91.2 kg)   BMI 35.61 kg/m    General: Well developed, well nourished,female appearing in no acute distress. Head: Normocephalic, atraumatic, sclera non-icteric, no xanthomas, nares are without discharge.    Neck: No carotid bruits. JVD not elevated.  Lungs: Respirations regular and unlabored, without wheezes or rales.  Heart: Regular rate and rhythm. No ectopic beats appreciated. No S3 or S4.  No murmur, no rubs, or gallops appreciated. Abdomen: Soft, non-tender, non-distended with normoactive bowel sounds. No hepatomegaly. No rebound/guarding. No obvious abdominal masses. Msk:  Strength and tone appear normal for age. No joint deformities or effusions. Extremities: No clubbing or cyanosis. No edema.  Distal pedal pulses are 2+ bilaterally. Neuro: Alert and oriented X 3. Moves all extremities spontaneously. No focal deficits noted. Psych:  Responds to questions appropriately with a normal affect. Skin: No rashes or lesions noted  Wt Readings from Last 3 Encounters:  10/16/16 201 lb (91.2 kg)      Studies/Labs Reviewed:   EKG:  EKG is not ordered today. EKG from 09/24/2016 is reviewed which shows normal sinus rhythm, HR 88, with no acute ST or T-wave changes  Recent Labs: No results found for requested labs within last 8760 hours.   Lipid Panel No results found for: CHOL, TRIG, HDL, CHOLHDL, VLDL, LDLCALC, LDLDIRECT   Assessment:    1. Heart palpitations   2. Mixed hyperlipidemia   3. Essential hypertension      Plan:   In order of problems listed above:  1. Palpitations - she experienced episodes of palpitations occurring 6 weeks ago which she describes as a fluttering in her chest which would last for seconds up to 3 minutes at a time and mostly occur at rest. She would experience multiple episodes during the week which would spontaneously resolve. She notes associated dizziness but denies any chest discomfort, dyspnea, orthopnea, lower extremity edema, or presyncope. - she denies any repeat episodes since that week. Has been able to perform yard work without any recurrent palpitations or anginal symptoms.  - labs were recently checked by her PCP and normal at that time per  the patient's report. EKG showed no acute changes (personally reviewed today).  - At this point in time, the utility of ordering an event monitor is low as she has not had symptoms in 5 weeks. If she were to develop recurrent symptoms, would order a cardiac event monitor to assess for possible arrhythmias (the patient will call if she develops recurrent palpitations and can order a 30-day event monitor at that time). In listening to her describe her symptoms, they seem most consistent with PAC's or PVC's. This assessment and plan was discussed with Dr. Martinique ([facility]) and he was in agreement with this.   2. HLD - followed by her PCP.  - she is currently on Lipitor 10mg  daily.   3. HTN - BP is well-controlled at 126/76 during today's visit.  - continue Lisinopril-HCTZ.   Medication Adjustments/Labs and Tests Ordered: Current medicines are reviewed at length with the patient today.  Concerns regarding medicines are outlined above.  Medication changes, Labs and Tests ordered today are listed in the Patient Instructions below. Patient Instructions  Medication Instructions: No changes   Follow-Up: Please follow up with Dr. Martinique as needed.  Any Additional Special Instructions Will Be Listed Below (If Applicable).  Please call the practice if you experience any palpitations.  If you need a refill on your cardiac medications before your next appointment, please call your pharmacy.    Signed, Erma Heritage, PA-C  10/16/2016 4:50 PM    [facility] [place], [place], [place]  [address] Phone: [phone]; Fax: [phone]  [address] [place], [place] [address] Phone: [phone]

## 2016-10-16 ENCOUNTER — Encounter: Payer: Self-pay | Admitting: Student

## 2016-10-16 ENCOUNTER — Ambulatory Visit (INDEPENDENT_AMBULATORY_CARE_PROVIDER_SITE_OTHER): Payer: Medicare Other | Admitting: Student

## 2016-10-16 VITALS — BP 126/76 | HR 88 | Ht 63.0 in | Wt 201.0 lb

## 2016-10-16 DIAGNOSIS — E785 Hyperlipidemia, unspecified: Secondary | ICD-10-CM | POA: Insufficient documentation

## 2016-10-16 DIAGNOSIS — E782 Mixed hyperlipidemia: Secondary | ICD-10-CM

## 2016-10-16 DIAGNOSIS — I10 Essential (primary) hypertension: Secondary | ICD-10-CM

## 2016-10-16 DIAGNOSIS — R002 Palpitations: Secondary | ICD-10-CM | POA: Insufficient documentation

## 2016-10-16 NOTE — Patient Instructions (Signed)
Medication Instructions: No changes   Follow-Up: Please follow up with Dr. Martinique as needed.  Any Additional Special Instructions Will Be Listed Below (If Applicable).  Please call the practice if you experience any palpitations.   If you need a refill on your cardiac medications before your next appointment, please call your pharmacy.

## 2016-10-23 DIAGNOSIS — M545 Low back pain: Secondary | ICD-10-CM | POA: Diagnosis not present

## 2016-10-23 DIAGNOSIS — M25551 Pain in right hip: Secondary | ICD-10-CM | POA: Diagnosis not present

## 2016-10-29 DIAGNOSIS — M545 Low back pain: Secondary | ICD-10-CM | POA: Diagnosis not present

## 2016-11-04 DIAGNOSIS — M25551 Pain in right hip: Secondary | ICD-10-CM | POA: Diagnosis not present

## 2016-11-04 DIAGNOSIS — M5116 Intervertebral disc disorders with radiculopathy, lumbar region: Secondary | ICD-10-CM | POA: Diagnosis not present

## 2016-11-04 DIAGNOSIS — M545 Low back pain: Secondary | ICD-10-CM | POA: Diagnosis not present

## 2016-11-04 DIAGNOSIS — M5136 Other intervertebral disc degeneration, lumbar region: Secondary | ICD-10-CM | POA: Diagnosis not present

## 2016-11-11 DIAGNOSIS — M5127 Other intervertebral disc displacement, lumbosacral region: Secondary | ICD-10-CM | POA: Diagnosis not present

## 2016-11-11 DIAGNOSIS — M545 Low back pain: Secondary | ICD-10-CM | POA: Diagnosis not present

## 2016-11-11 DIAGNOSIS — M79604 Pain in right leg: Secondary | ICD-10-CM | POA: Diagnosis not present

## 2016-11-11 DIAGNOSIS — M5136 Other intervertebral disc degeneration, lumbar region: Secondary | ICD-10-CM | POA: Diagnosis not present

## 2016-11-19 DIAGNOSIS — M545 Low back pain: Secondary | ICD-10-CM | POA: Diagnosis not present

## 2016-11-19 DIAGNOSIS — M5136 Other intervertebral disc degeneration, lumbar region: Secondary | ICD-10-CM | POA: Diagnosis not present

## 2016-11-19 DIAGNOSIS — M79604 Pain in right leg: Secondary | ICD-10-CM | POA: Diagnosis not present

## 2016-11-19 DIAGNOSIS — M5127 Other intervertebral disc displacement, lumbosacral region: Secondary | ICD-10-CM | POA: Diagnosis not present

## 2016-11-22 DIAGNOSIS — M79604 Pain in right leg: Secondary | ICD-10-CM | POA: Diagnosis not present

## 2016-11-22 DIAGNOSIS — M5136 Other intervertebral disc degeneration, lumbar region: Secondary | ICD-10-CM | POA: Diagnosis not present

## 2016-11-22 DIAGNOSIS — M545 Low back pain: Secondary | ICD-10-CM | POA: Diagnosis not present

## 2016-11-22 DIAGNOSIS — M5127 Other intervertebral disc displacement, lumbosacral region: Secondary | ICD-10-CM | POA: Diagnosis not present

## 2016-11-26 DIAGNOSIS — M5127 Other intervertebral disc displacement, lumbosacral region: Secondary | ICD-10-CM | POA: Diagnosis not present

## 2016-11-26 DIAGNOSIS — M545 Low back pain: Secondary | ICD-10-CM | POA: Diagnosis not present

## 2016-11-26 DIAGNOSIS — M79604 Pain in right leg: Secondary | ICD-10-CM | POA: Diagnosis not present

## 2016-11-26 DIAGNOSIS — M5136 Other intervertebral disc degeneration, lumbar region: Secondary | ICD-10-CM | POA: Diagnosis not present

## 2016-11-29 DIAGNOSIS — M79604 Pain in right leg: Secondary | ICD-10-CM | POA: Diagnosis not present

## 2016-11-29 DIAGNOSIS — M545 Low back pain: Secondary | ICD-10-CM | POA: Diagnosis not present

## 2016-11-29 DIAGNOSIS — M5136 Other intervertebral disc degeneration, lumbar region: Secondary | ICD-10-CM | POA: Diagnosis not present

## 2016-11-29 DIAGNOSIS — M5127 Other intervertebral disc displacement, lumbosacral region: Secondary | ICD-10-CM | POA: Diagnosis not present

## 2016-12-10 DIAGNOSIS — M5136 Other intervertebral disc degeneration, lumbar region: Secondary | ICD-10-CM | POA: Diagnosis not present

## 2016-12-10 DIAGNOSIS — M545 Low back pain: Secondary | ICD-10-CM | POA: Diagnosis not present

## 2016-12-10 DIAGNOSIS — M5127 Other intervertebral disc displacement, lumbosacral region: Secondary | ICD-10-CM | POA: Diagnosis not present

## 2016-12-10 DIAGNOSIS — M79604 Pain in right leg: Secondary | ICD-10-CM | POA: Diagnosis not present

## 2016-12-13 DIAGNOSIS — M5127 Other intervertebral disc displacement, lumbosacral region: Secondary | ICD-10-CM | POA: Diagnosis not present

## 2016-12-13 DIAGNOSIS — M79604 Pain in right leg: Secondary | ICD-10-CM | POA: Diagnosis not present

## 2016-12-13 DIAGNOSIS — M5136 Other intervertebral disc degeneration, lumbar region: Secondary | ICD-10-CM | POA: Diagnosis not present

## 2016-12-13 DIAGNOSIS — M545 Low back pain: Secondary | ICD-10-CM | POA: Diagnosis not present

## 2016-12-23 DIAGNOSIS — M5127 Other intervertebral disc displacement, lumbosacral region: Secondary | ICD-10-CM | POA: Diagnosis not present

## 2016-12-23 DIAGNOSIS — M545 Low back pain: Secondary | ICD-10-CM | POA: Diagnosis not present

## 2016-12-23 DIAGNOSIS — M79604 Pain in right leg: Secondary | ICD-10-CM | POA: Diagnosis not present

## 2016-12-23 DIAGNOSIS — M5136 Other intervertebral disc degeneration, lumbar region: Secondary | ICD-10-CM | POA: Diagnosis not present

## 2016-12-25 DIAGNOSIS — M545 Low back pain: Secondary | ICD-10-CM | POA: Diagnosis not present

## 2016-12-25 DIAGNOSIS — M5136 Other intervertebral disc degeneration, lumbar region: Secondary | ICD-10-CM | POA: Diagnosis not present

## 2016-12-25 DIAGNOSIS — M79604 Pain in right leg: Secondary | ICD-10-CM | POA: Diagnosis not present

## 2016-12-25 DIAGNOSIS — M5127 Other intervertebral disc displacement, lumbosacral region: Secondary | ICD-10-CM | POA: Diagnosis not present

## 2016-12-30 DIAGNOSIS — M545 Low back pain: Secondary | ICD-10-CM | POA: Diagnosis not present

## 2016-12-30 DIAGNOSIS — M5127 Other intervertebral disc displacement, lumbosacral region: Secondary | ICD-10-CM | POA: Diagnosis not present

## 2016-12-30 DIAGNOSIS — M5136 Other intervertebral disc degeneration, lumbar region: Secondary | ICD-10-CM | POA: Diagnosis not present

## 2016-12-30 DIAGNOSIS — M79604 Pain in right leg: Secondary | ICD-10-CM | POA: Diagnosis not present

## 2017-01-01 DIAGNOSIS — M5416 Radiculopathy, lumbar region: Secondary | ICD-10-CM | POA: Diagnosis not present

## 2017-01-01 DIAGNOSIS — M5136 Other intervertebral disc degeneration, lumbar region: Secondary | ICD-10-CM | POA: Diagnosis not present

## 2017-01-01 DIAGNOSIS — M545 Low back pain: Secondary | ICD-10-CM | POA: Diagnosis not present

## 2017-01-01 DIAGNOSIS — M5127 Other intervertebral disc displacement, lumbosacral region: Secondary | ICD-10-CM | POA: Diagnosis not present

## 2017-01-14 DIAGNOSIS — M5136 Other intervertebral disc degeneration, lumbar region: Secondary | ICD-10-CM | POA: Diagnosis not present

## 2017-01-14 DIAGNOSIS — M7061 Trochanteric bursitis, right hip: Secondary | ICD-10-CM | POA: Diagnosis not present

## 2017-01-14 DIAGNOSIS — M545 Low back pain: Secondary | ICD-10-CM | POA: Diagnosis not present

## 2017-01-14 DIAGNOSIS — M5416 Radiculopathy, lumbar region: Secondary | ICD-10-CM | POA: Diagnosis not present

## 2017-01-14 DIAGNOSIS — M79604 Pain in right leg: Secondary | ICD-10-CM | POA: Diagnosis not present

## 2017-01-14 DIAGNOSIS — M5127 Other intervertebral disc displacement, lumbosacral region: Secondary | ICD-10-CM | POA: Diagnosis not present

## 2017-01-17 DIAGNOSIS — M7061 Trochanteric bursitis, right hip: Secondary | ICD-10-CM | POA: Diagnosis not present

## 2017-01-27 DIAGNOSIS — M5127 Other intervertebral disc displacement, lumbosacral region: Secondary | ICD-10-CM | POA: Diagnosis not present

## 2017-01-27 DIAGNOSIS — M5136 Other intervertebral disc degeneration, lumbar region: Secondary | ICD-10-CM | POA: Diagnosis not present

## 2017-01-27 DIAGNOSIS — M545 Low back pain: Secondary | ICD-10-CM | POA: Diagnosis not present

## 2017-01-27 DIAGNOSIS — M79604 Pain in right leg: Secondary | ICD-10-CM | POA: Diagnosis not present

## 2017-01-30 DIAGNOSIS — M79604 Pain in right leg: Secondary | ICD-10-CM | POA: Diagnosis not present

## 2017-01-30 DIAGNOSIS — M5136 Other intervertebral disc degeneration, lumbar region: Secondary | ICD-10-CM | POA: Diagnosis not present

## 2017-01-30 DIAGNOSIS — M545 Low back pain: Secondary | ICD-10-CM | POA: Diagnosis not present

## 2017-01-30 DIAGNOSIS — M5127 Other intervertebral disc displacement, lumbosacral region: Secondary | ICD-10-CM | POA: Diagnosis not present

## 2017-02-04 DIAGNOSIS — M5127 Other intervertebral disc displacement, lumbosacral region: Secondary | ICD-10-CM | POA: Diagnosis not present

## 2017-02-04 DIAGNOSIS — M5136 Other intervertebral disc degeneration, lumbar region: Secondary | ICD-10-CM | POA: Diagnosis not present

## 2017-02-04 DIAGNOSIS — M79604 Pain in right leg: Secondary | ICD-10-CM | POA: Diagnosis not present

## 2017-02-04 DIAGNOSIS — M545 Low back pain: Secondary | ICD-10-CM | POA: Diagnosis not present

## 2017-02-07 DIAGNOSIS — M79604 Pain in right leg: Secondary | ICD-10-CM | POA: Diagnosis not present

## 2017-02-07 DIAGNOSIS — M5136 Other intervertebral disc degeneration, lumbar region: Secondary | ICD-10-CM | POA: Diagnosis not present

## 2017-02-07 DIAGNOSIS — M7061 Trochanteric bursitis, right hip: Secondary | ICD-10-CM | POA: Diagnosis not present

## 2017-02-07 DIAGNOSIS — M545 Low back pain: Secondary | ICD-10-CM | POA: Diagnosis not present

## 2017-02-10 DIAGNOSIS — M545 Low back pain: Secondary | ICD-10-CM | POA: Diagnosis not present

## 2017-02-10 DIAGNOSIS — M5136 Other intervertebral disc degeneration, lumbar region: Secondary | ICD-10-CM | POA: Diagnosis not present

## 2017-02-10 DIAGNOSIS — M7062 Trochanteric bursitis, left hip: Secondary | ICD-10-CM | POA: Diagnosis not present

## 2017-02-10 DIAGNOSIS — M5127 Other intervertebral disc displacement, lumbosacral region: Secondary | ICD-10-CM | POA: Diagnosis not present

## 2017-02-11 DIAGNOSIS — M79604 Pain in right leg: Secondary | ICD-10-CM | POA: Diagnosis not present

## 2017-02-11 DIAGNOSIS — M7061 Trochanteric bursitis, right hip: Secondary | ICD-10-CM | POA: Diagnosis not present

## 2017-02-11 DIAGNOSIS — M545 Low back pain: Secondary | ICD-10-CM | POA: Diagnosis not present

## 2017-02-11 DIAGNOSIS — M5136 Other intervertebral disc degeneration, lumbar region: Secondary | ICD-10-CM | POA: Diagnosis not present

## 2017-02-14 DIAGNOSIS — M7061 Trochanteric bursitis, right hip: Secondary | ICD-10-CM | POA: Diagnosis not present

## 2017-02-14 DIAGNOSIS — M5136 Other intervertebral disc degeneration, lumbar region: Secondary | ICD-10-CM | POA: Diagnosis not present

## 2017-02-14 DIAGNOSIS — M545 Low back pain: Secondary | ICD-10-CM | POA: Diagnosis not present

## 2017-02-14 DIAGNOSIS — M79604 Pain in right leg: Secondary | ICD-10-CM | POA: Diagnosis not present

## 2017-02-17 DIAGNOSIS — M5136 Other intervertebral disc degeneration, lumbar region: Secondary | ICD-10-CM | POA: Diagnosis not present

## 2017-02-17 DIAGNOSIS — M545 Low back pain: Secondary | ICD-10-CM | POA: Diagnosis not present

## 2017-02-17 DIAGNOSIS — M79604 Pain in right leg: Secondary | ICD-10-CM | POA: Diagnosis not present

## 2017-02-17 DIAGNOSIS — M7061 Trochanteric bursitis, right hip: Secondary | ICD-10-CM | POA: Diagnosis not present

## 2017-02-20 DIAGNOSIS — M5136 Other intervertebral disc degeneration, lumbar region: Secondary | ICD-10-CM | POA: Diagnosis not present

## 2017-02-20 DIAGNOSIS — M545 Low back pain: Secondary | ICD-10-CM | POA: Diagnosis not present

## 2017-02-20 DIAGNOSIS — M7061 Trochanteric bursitis, right hip: Secondary | ICD-10-CM | POA: Diagnosis not present

## 2017-02-20 DIAGNOSIS — M5127 Other intervertebral disc displacement, lumbosacral region: Secondary | ICD-10-CM | POA: Diagnosis not present

## 2017-02-27 DIAGNOSIS — M5136 Other intervertebral disc degeneration, lumbar region: Secondary | ICD-10-CM | POA: Diagnosis not present

## 2017-02-27 DIAGNOSIS — M545 Low back pain: Secondary | ICD-10-CM | POA: Diagnosis not present

## 2017-02-27 DIAGNOSIS — M79604 Pain in right leg: Secondary | ICD-10-CM | POA: Diagnosis not present

## 2017-02-27 DIAGNOSIS — M5127 Other intervertebral disc displacement, lumbosacral region: Secondary | ICD-10-CM | POA: Diagnosis not present

## 2017-03-06 DIAGNOSIS — M79604 Pain in right leg: Secondary | ICD-10-CM | POA: Diagnosis not present

## 2017-03-06 DIAGNOSIS — M545 Low back pain: Secondary | ICD-10-CM | POA: Diagnosis not present

## 2017-03-06 DIAGNOSIS — M5136 Other intervertebral disc degeneration, lumbar region: Secondary | ICD-10-CM | POA: Diagnosis not present

## 2017-03-06 DIAGNOSIS — M7061 Trochanteric bursitis, right hip: Secondary | ICD-10-CM | POA: Diagnosis not present

## 2017-03-10 ENCOUNTER — Other Ambulatory Visit: Payer: Self-pay | Admitting: Family Medicine

## 2017-03-10 DIAGNOSIS — Z1231 Encounter for screening mammogram for malignant neoplasm of breast: Secondary | ICD-10-CM

## 2017-03-13 DIAGNOSIS — M7061 Trochanteric bursitis, right hip: Secondary | ICD-10-CM | POA: Diagnosis not present

## 2017-03-13 DIAGNOSIS — M545 Low back pain: Secondary | ICD-10-CM | POA: Diagnosis not present

## 2017-03-13 DIAGNOSIS — M79604 Pain in right leg: Secondary | ICD-10-CM | POA: Diagnosis not present

## 2017-03-13 DIAGNOSIS — M5136 Other intervertebral disc degeneration, lumbar region: Secondary | ICD-10-CM | POA: Diagnosis not present

## 2017-03-27 DIAGNOSIS — Z6834 Body mass index (BMI) 34.0-34.9, adult: Secondary | ICD-10-CM | POA: Diagnosis not present

## 2017-03-27 DIAGNOSIS — Z23 Encounter for immunization: Secondary | ICD-10-CM | POA: Diagnosis not present

## 2017-03-27 DIAGNOSIS — E785 Hyperlipidemia, unspecified: Secondary | ICD-10-CM | POA: Diagnosis not present

## 2017-03-27 DIAGNOSIS — N183 Chronic kidney disease, stage 3 (moderate): Secondary | ICD-10-CM | POA: Diagnosis not present

## 2017-03-27 DIAGNOSIS — F411 Generalized anxiety disorder: Secondary | ICD-10-CM | POA: Diagnosis not present

## 2017-03-27 DIAGNOSIS — I129 Hypertensive chronic kidney disease with stage 1 through stage 4 chronic kidney disease, or unspecified chronic kidney disease: Secondary | ICD-10-CM | POA: Diagnosis not present

## 2017-04-08 ENCOUNTER — Ambulatory Visit
Admission: RE | Admit: 2017-04-08 | Discharge: 2017-04-08 | Disposition: A | Discharge status: Home / Self Care | Payer: Medicare Other | Source: Ambulatory Visit | Attending: Family Medicine | Admitting: Family Medicine

## 2017-04-08 DIAGNOSIS — Z1231 Encounter for screening mammogram for malignant neoplasm of breast: Secondary | ICD-10-CM

## 2017-09-25 DIAGNOSIS — I129 Hypertensive chronic kidney disease with stage 1 through stage 4 chronic kidney disease, or unspecified chronic kidney disease: Secondary | ICD-10-CM | POA: Diagnosis not present

## 2017-09-25 DIAGNOSIS — M8588 Other specified disorders of bone density and structure, other site: Secondary | ICD-10-CM | POA: Diagnosis not present

## 2017-09-25 DIAGNOSIS — E6609 Other obesity due to excess calories: Secondary | ICD-10-CM | POA: Diagnosis not present

## 2017-09-25 DIAGNOSIS — Z658 Other specified problems related to psychosocial circumstances: Secondary | ICD-10-CM | POA: Diagnosis not present

## 2017-09-25 DIAGNOSIS — E785 Hyperlipidemia, unspecified: Secondary | ICD-10-CM | POA: Diagnosis not present

## 2017-09-25 DIAGNOSIS — Z6834 Body mass index (BMI) 34.0-34.9, adult: Secondary | ICD-10-CM | POA: Diagnosis not present

## 2017-09-25 DIAGNOSIS — N183 Chronic kidney disease, stage 3 (moderate): Secondary | ICD-10-CM | POA: Diagnosis not present

## 2017-09-25 DIAGNOSIS — Z0000 Encounter for general adult medical examination without abnormal findings: Secondary | ICD-10-CM | POA: Diagnosis not present

## 2017-09-25 DIAGNOSIS — M461 Sacroiliitis, not elsewhere classified: Secondary | ICD-10-CM | POA: Diagnosis not present

## 2017-11-18 DIAGNOSIS — H524 Presbyopia: Secondary | ICD-10-CM | POA: Diagnosis not present

## 2017-11-18 DIAGNOSIS — H2513 Age-related nuclear cataract, bilateral: Secondary | ICD-10-CM | POA: Diagnosis not present

## 2017-11-18 DIAGNOSIS — H40011 Open angle with borderline findings, low risk, right eye: Secondary | ICD-10-CM | POA: Diagnosis not present

## 2017-12-01 DIAGNOSIS — M8588 Other specified disorders of bone density and structure, other site: Secondary | ICD-10-CM | POA: Diagnosis not present

## 2018-03-27 DIAGNOSIS — F411 Generalized anxiety disorder: Secondary | ICD-10-CM | POA: Diagnosis not present

## 2018-03-27 DIAGNOSIS — N183 Chronic kidney disease, stage 3 (moderate): Secondary | ICD-10-CM | POA: Diagnosis not present

## 2018-03-27 DIAGNOSIS — E785 Hyperlipidemia, unspecified: Secondary | ICD-10-CM | POA: Diagnosis not present

## 2018-03-27 DIAGNOSIS — I129 Hypertensive chronic kidney disease with stage 1 through stage 4 chronic kidney disease, or unspecified chronic kidney disease: Secondary | ICD-10-CM | POA: Diagnosis not present

## 2018-03-27 DIAGNOSIS — Z23 Encounter for immunization: Secondary | ICD-10-CM | POA: Diagnosis not present

## 2018-03-27 DIAGNOSIS — L989 Disorder of the skin and subcutaneous tissue, unspecified: Secondary | ICD-10-CM | POA: Diagnosis not present

## 2018-05-22 DIAGNOSIS — L720 Epidermal cyst: Secondary | ICD-10-CM | POA: Diagnosis not present

## 2018-05-22 DIAGNOSIS — D220 Melanocytic nevi of lip: Secondary | ICD-10-CM | POA: Diagnosis not present

## 2018-05-22 DIAGNOSIS — D1801 Hemangioma of skin and subcutaneous tissue: Secondary | ICD-10-CM | POA: Diagnosis not present

## 2018-05-22 DIAGNOSIS — L821 Other seborrheic keratosis: Secondary | ICD-10-CM | POA: Diagnosis not present

## 2018-05-22 DIAGNOSIS — L918 Other hypertrophic disorders of the skin: Secondary | ICD-10-CM | POA: Diagnosis not present

## 2018-05-28 DIAGNOSIS — I129 Hypertensive chronic kidney disease with stage 1 through stage 4 chronic kidney disease, or unspecified chronic kidney disease: Secondary | ICD-10-CM | POA: Diagnosis not present

## 2018-05-28 DIAGNOSIS — F411 Generalized anxiety disorder: Secondary | ICD-10-CM | POA: Diagnosis not present

## 2018-05-28 DIAGNOSIS — N183 Chronic kidney disease, stage 3 (moderate): Secondary | ICD-10-CM | POA: Diagnosis not present

## 2018-05-28 DIAGNOSIS — F4010 Social phobia, unspecified: Secondary | ICD-10-CM | POA: Diagnosis not present

## 2018-11-04 DIAGNOSIS — L84 Corns and callosities: Secondary | ICD-10-CM | POA: Diagnosis not present

## 2018-11-04 DIAGNOSIS — F411 Generalized anxiety disorder: Secondary | ICD-10-CM | POA: Diagnosis not present

## 2018-11-04 DIAGNOSIS — E785 Hyperlipidemia, unspecified: Secondary | ICD-10-CM | POA: Diagnosis not present

## 2018-11-04 DIAGNOSIS — N183 Chronic kidney disease, stage 3 (moderate): Secondary | ICD-10-CM | POA: Diagnosis not present

## 2018-11-04 DIAGNOSIS — I129 Hypertensive chronic kidney disease with stage 1 through stage 4 chronic kidney disease, or unspecified chronic kidney disease: Secondary | ICD-10-CM | POA: Diagnosis not present

## 2018-11-04 DIAGNOSIS — Z0000 Encounter for general adult medical examination without abnormal findings: Secondary | ICD-10-CM | POA: Diagnosis not present

## 2018-11-17 DIAGNOSIS — M79672 Pain in left foot: Secondary | ICD-10-CM | POA: Diagnosis not present

## 2018-11-17 DIAGNOSIS — D2372 Other benign neoplasm of skin of left lower limb, including hip: Secondary | ICD-10-CM | POA: Diagnosis not present

## 2018-12-25 DIAGNOSIS — L237 Allergic contact dermatitis due to plants, except food: Secondary | ICD-10-CM | POA: Diagnosis not present

## 2019-03-10 DIAGNOSIS — Z23 Encounter for immunization: Secondary | ICD-10-CM | POA: Diagnosis not present

## 2019-06-04 ENCOUNTER — Ambulatory Visit: Payer: Medicare Other | Attending: Internal Medicine

## 2019-06-04 DIAGNOSIS — Z23 Encounter for immunization: Secondary | ICD-10-CM | POA: Insufficient documentation

## 2019-06-04 NOTE — Progress Notes (Signed)
   Covid-19 Vaccination Clinic  Name:  Ann Turner    MRN: [id number] DOB: [birth year]  06/04/2019  [name] was observed post Covid-19 immunization for 15 minutes without incidence. She was provided with Vaccine Information Sheet and instruction to access the V-Safe system.   [name] was instructed to call 911 with any severe reactions post vaccine: Marland Kitchen Difficulty breathing  . Swelling of your face and throat  . A fast heartbeat  . A bad rash all over your body  . Dizziness and weakness    Immunizations Administered    Name Date Dose VIS Date Route   Pfizer COVID-19 Vaccine 06/04/2019  4:58 PM 0.3 mL 04/23/2019 Intramuscular   Manufacturer: [place]   Lot: [id number]   [place]: [id number]

## 2019-06-25 ENCOUNTER — Ambulatory Visit: Payer: Medicare Other | Attending: Internal Medicine

## 2019-06-25 DIAGNOSIS — Z23 Encounter for immunization: Secondary | ICD-10-CM | POA: Insufficient documentation

## 2019-06-25 NOTE — Progress Notes (Signed)
   Covid-19 Vaccination Clinic  Name:  Ann Turner    MRN: [id number] DOB: 1945-11-27  06/25/2019  [name] was observed post Covid-19 immunization for 15 minutes without incidence. She was provided with Vaccine Information Sheet and instruction to access the V-Safe system.   [name] was instructed to call 911 with any severe reactions post vaccine: Marland Kitchen Difficulty breathing  . Swelling of your face and throat  . A fast heartbeat  . A bad rash all over your body  . Dizziness and weakness    Immunizations Administered    Name Date Dose VIS Date Route   Pfizer COVID-19 Vaccine 06/25/2019  4:25 PM 0.3 mL 04/23/2019 Intramuscular   Manufacturer: [place]   Lot: [id number]   [place]: [id number]

## 2019-08-26 DIAGNOSIS — M545 Low back pain: Secondary | ICD-10-CM | POA: Diagnosis not present

## 2019-08-26 DIAGNOSIS — M5416 Radiculopathy, lumbar region: Secondary | ICD-10-CM | POA: Diagnosis not present

## 2019-09-03 DIAGNOSIS — M545 Low back pain: Secondary | ICD-10-CM | POA: Diagnosis not present

## 2019-09-03 DIAGNOSIS — M5416 Radiculopathy, lumbar region: Secondary | ICD-10-CM | POA: Diagnosis not present

## 2019-09-28 DIAGNOSIS — M545 Low back pain: Secondary | ICD-10-CM | POA: Diagnosis not present

## 2019-09-28 DIAGNOSIS — M5416 Radiculopathy, lumbar region: Secondary | ICD-10-CM | POA: Diagnosis not present

## 2019-10-13 DIAGNOSIS — M545 Low back pain: Secondary | ICD-10-CM | POA: Diagnosis not present

## 2019-10-13 DIAGNOSIS — M5416 Radiculopathy, lumbar region: Secondary | ICD-10-CM | POA: Diagnosis not present

## 2020-03-02 DIAGNOSIS — Z23 Encounter for immunization: Secondary | ICD-10-CM | POA: Diagnosis not present

## 2020-03-24 DIAGNOSIS — R110 Nausea: Secondary | ICD-10-CM | POA: Diagnosis not present

## 2020-03-24 DIAGNOSIS — R197 Diarrhea, unspecified: Secondary | ICD-10-CM | POA: Diagnosis not present

## 2020-04-13 DIAGNOSIS — Z23 Encounter for immunization: Secondary | ICD-10-CM | POA: Diagnosis not present

## 2020-04-13 DIAGNOSIS — E785 Hyperlipidemia, unspecified: Secondary | ICD-10-CM | POA: Diagnosis not present

## 2020-04-13 DIAGNOSIS — F411 Generalized anxiety disorder: Secondary | ICD-10-CM | POA: Diagnosis not present

## 2020-04-13 DIAGNOSIS — I129 Hypertensive chronic kidney disease with stage 1 through stage 4 chronic kidney disease, or unspecified chronic kidney disease: Secondary | ICD-10-CM | POA: Diagnosis not present

## 2020-04-13 DIAGNOSIS — N183 Chronic kidney disease, stage 3 unspecified: Secondary | ICD-10-CM | POA: Diagnosis not present

## 2020-07-04 DIAGNOSIS — Z01812 Encounter for preprocedural laboratory examination: Secondary | ICD-10-CM | POA: Diagnosis not present

## 2020-07-07 DIAGNOSIS — K635 Polyp of colon: Secondary | ICD-10-CM | POA: Diagnosis not present

## 2020-07-07 DIAGNOSIS — Z86010 Personal history of colonic polyps: Secondary | ICD-10-CM | POA: Diagnosis not present

## 2020-07-07 DIAGNOSIS — K621 Rectal polyp: Secondary | ICD-10-CM | POA: Diagnosis not present

## 2020-07-11 DIAGNOSIS — K635 Polyp of colon: Secondary | ICD-10-CM | POA: Diagnosis not present

## 2020-07-11 DIAGNOSIS — K621 Rectal polyp: Secondary | ICD-10-CM | POA: Diagnosis not present

## 2020-09-19 DIAGNOSIS — H40011 Open angle with borderline findings, low risk, right eye: Secondary | ICD-10-CM | POA: Diagnosis not present

## 2020-11-09 DIAGNOSIS — I129 Hypertensive chronic kidney disease with stage 1 through stage 4 chronic kidney disease, or unspecified chronic kidney disease: Secondary | ICD-10-CM | POA: Diagnosis not present

## 2020-11-09 DIAGNOSIS — N183 Chronic kidney disease, stage 3 unspecified: Secondary | ICD-10-CM | POA: Diagnosis not present

## 2020-11-09 DIAGNOSIS — Z1389 Encounter for screening for other disorder: Secondary | ICD-10-CM | POA: Diagnosis not present

## 2020-11-09 DIAGNOSIS — M8588 Other specified disorders of bone density and structure, other site: Secondary | ICD-10-CM | POA: Diagnosis not present

## 2020-11-09 DIAGNOSIS — E785 Hyperlipidemia, unspecified: Secondary | ICD-10-CM | POA: Diagnosis not present

## 2020-11-09 DIAGNOSIS — L900 Lichen sclerosus et atrophicus: Secondary | ICD-10-CM | POA: Diagnosis not present

## 2020-11-09 DIAGNOSIS — F4010 Social phobia, unspecified: Secondary | ICD-10-CM | POA: Diagnosis not present

## 2020-11-09 DIAGNOSIS — Z0000 Encounter for general adult medical examination without abnormal findings: Secondary | ICD-10-CM | POA: Diagnosis not present

## 2020-11-15 ENCOUNTER — Other Ambulatory Visit: Payer: Self-pay | Admitting: Family Medicine

## 2020-11-15 DIAGNOSIS — Z1231 Encounter for screening mammogram for malignant neoplasm of breast: Secondary | ICD-10-CM

## 2020-11-15 DIAGNOSIS — M8580 Other specified disorders of bone density and structure, unspecified site: Secondary | ICD-10-CM

## 2020-12-18 DIAGNOSIS — K136 Irritative hyperplasia of oral mucosa: Secondary | ICD-10-CM | POA: Diagnosis not present

## 2020-12-18 DIAGNOSIS — D1039 Benign neoplasm of other parts of mouth: Secondary | ICD-10-CM | POA: Diagnosis not present

## 2020-12-20 DIAGNOSIS — K1321 Leukoplakia of oral mucosa, including tongue: Secondary | ICD-10-CM | POA: Diagnosis not present

## 2020-12-20 DIAGNOSIS — K136 Irritative hyperplasia of oral mucosa: Secondary | ICD-10-CM | POA: Diagnosis not present

## 2020-12-20 DIAGNOSIS — E8581 Light chain (AL) amyloidosis: Secondary | ICD-10-CM | POA: Diagnosis not present

## 2020-12-20 DIAGNOSIS — D1039 Benign neoplasm of other parts of mouth: Secondary | ICD-10-CM | POA: Diagnosis not present

## 2020-12-25 DIAGNOSIS — D1039 Benign neoplasm of other parts of mouth: Secondary | ICD-10-CM | POA: Diagnosis not present

## 2021-01-29 DIAGNOSIS — H40011 Open angle with borderline findings, low risk, right eye: Secondary | ICD-10-CM | POA: Diagnosis not present

## 2021-01-31 DIAGNOSIS — U071 COVID-19: Secondary | ICD-10-CM | POA: Diagnosis not present

## 2021-02-07 DIAGNOSIS — U071 COVID-19: Secondary | ICD-10-CM | POA: Diagnosis not present

## 2021-03-20 DIAGNOSIS — H2511 Age-related nuclear cataract, right eye: Secondary | ICD-10-CM | POA: Diagnosis not present

## 2021-03-20 DIAGNOSIS — H25043 Posterior subcapsular polar age-related cataract, bilateral: Secondary | ICD-10-CM | POA: Diagnosis not present

## 2021-03-20 DIAGNOSIS — H2513 Age-related nuclear cataract, bilateral: Secondary | ICD-10-CM | POA: Diagnosis not present

## 2021-03-20 DIAGNOSIS — H25013 Cortical age-related cataract, bilateral: Secondary | ICD-10-CM | POA: Diagnosis not present

## 2021-03-20 DIAGNOSIS — H18413 Arcus senilis, bilateral: Secondary | ICD-10-CM | POA: Diagnosis not present

## 2021-04-16 DIAGNOSIS — M25562 Pain in left knee: Secondary | ICD-10-CM | POA: Diagnosis not present

## 2021-04-26 DIAGNOSIS — I129 Hypertensive chronic kidney disease with stage 1 through stage 4 chronic kidney disease, or unspecified chronic kidney disease: Secondary | ICD-10-CM | POA: Diagnosis not present

## 2021-04-26 DIAGNOSIS — M25562 Pain in left knee: Secondary | ICD-10-CM | POA: Diagnosis not present

## 2021-04-26 DIAGNOSIS — E785 Hyperlipidemia, unspecified: Secondary | ICD-10-CM | POA: Diagnosis not present

## 2021-04-26 DIAGNOSIS — Z23 Encounter for immunization: Secondary | ICD-10-CM | POA: Diagnosis not present

## 2021-04-26 DIAGNOSIS — N183 Chronic kidney disease, stage 3 unspecified: Secondary | ICD-10-CM | POA: Diagnosis not present

## 2021-04-28 DIAGNOSIS — M25562 Pain in left knee: Secondary | ICD-10-CM | POA: Diagnosis not present

## 2021-04-30 DIAGNOSIS — M25562 Pain in left knee: Secondary | ICD-10-CM | POA: Diagnosis not present

## 2021-05-02 DIAGNOSIS — M6281 Muscle weakness (generalized): Secondary | ICD-10-CM | POA: Diagnosis not present

## 2021-05-02 DIAGNOSIS — R262 Difficulty in walking, not elsewhere classified: Secondary | ICD-10-CM | POA: Diagnosis not present

## 2021-05-02 DIAGNOSIS — M25662 Stiffness of left knee, not elsewhere classified: Secondary | ICD-10-CM | POA: Diagnosis not present

## 2021-05-02 DIAGNOSIS — S83242D Other tear of medial meniscus, current injury, left knee, subsequent encounter: Secondary | ICD-10-CM | POA: Diagnosis not present

## 2021-05-02 DIAGNOSIS — M1712 Unilateral primary osteoarthritis, left knee: Secondary | ICD-10-CM | POA: Diagnosis not present

## 2021-05-02 DIAGNOSIS — M25562 Pain in left knee: Secondary | ICD-10-CM | POA: Diagnosis not present

## 2021-05-04 DIAGNOSIS — M25562 Pain in left knee: Secondary | ICD-10-CM | POA: Diagnosis not present

## 2021-05-04 DIAGNOSIS — M25662 Stiffness of left knee, not elsewhere classified: Secondary | ICD-10-CM | POA: Diagnosis not present

## 2021-05-04 DIAGNOSIS — R262 Difficulty in walking, not elsewhere classified: Secondary | ICD-10-CM | POA: Diagnosis not present

## 2021-05-04 DIAGNOSIS — M6281 Muscle weakness (generalized): Secondary | ICD-10-CM | POA: Diagnosis not present

## 2021-05-04 DIAGNOSIS — S83242D Other tear of medial meniscus, current injury, left knee, subsequent encounter: Secondary | ICD-10-CM | POA: Diagnosis not present

## 2021-05-04 DIAGNOSIS — M1712 Unilateral primary osteoarthritis, left knee: Secondary | ICD-10-CM | POA: Diagnosis not present

## 2021-05-09 DIAGNOSIS — S83242D Other tear of medial meniscus, current injury, left knee, subsequent encounter: Secondary | ICD-10-CM | POA: Diagnosis not present

## 2021-05-09 DIAGNOSIS — M6281 Muscle weakness (generalized): Secondary | ICD-10-CM | POA: Diagnosis not present

## 2021-05-09 DIAGNOSIS — R262 Difficulty in walking, not elsewhere classified: Secondary | ICD-10-CM | POA: Diagnosis not present

## 2021-05-09 DIAGNOSIS — M25662 Stiffness of left knee, not elsewhere classified: Secondary | ICD-10-CM | POA: Diagnosis not present

## 2021-05-09 DIAGNOSIS — M25562 Pain in left knee: Secondary | ICD-10-CM | POA: Diagnosis not present

## 2021-05-09 DIAGNOSIS — M1712 Unilateral primary osteoarthritis, left knee: Secondary | ICD-10-CM | POA: Diagnosis not present

## 2021-05-10 ENCOUNTER — Ambulatory Visit
Admission: RE | Admit: 2021-05-10 | Discharge: 2021-05-10 | Disposition: A | Discharge status: Home / Self Care | Payer: Medicare Other | Source: Ambulatory Visit | Attending: Family Medicine | Admitting: Family Medicine

## 2021-05-10 DIAGNOSIS — M8580 Other specified disorders of bone density and structure, unspecified site: Secondary | ICD-10-CM

## 2021-05-10 DIAGNOSIS — Z1231 Encounter for screening mammogram for malignant neoplasm of breast: Secondary | ICD-10-CM | POA: Diagnosis not present

## 2021-05-10 DIAGNOSIS — M85852 Other specified disorders of bone density and structure, left thigh: Secondary | ICD-10-CM | POA: Diagnosis not present

## 2021-05-10 DIAGNOSIS — Z780 Asymptomatic menopausal state: Secondary | ICD-10-CM | POA: Diagnosis not present

## 2021-05-11 DIAGNOSIS — M6281 Muscle weakness (generalized): Secondary | ICD-10-CM | POA: Diagnosis not present

## 2021-05-11 DIAGNOSIS — M25662 Stiffness of left knee, not elsewhere classified: Secondary | ICD-10-CM | POA: Diagnosis not present

## 2021-05-11 DIAGNOSIS — S83242D Other tear of medial meniscus, current injury, left knee, subsequent encounter: Secondary | ICD-10-CM | POA: Diagnosis not present

## 2021-05-11 DIAGNOSIS — R262 Difficulty in walking, not elsewhere classified: Secondary | ICD-10-CM | POA: Diagnosis not present

## 2021-05-11 DIAGNOSIS — M25562 Pain in left knee: Secondary | ICD-10-CM | POA: Diagnosis not present

## 2021-05-11 DIAGNOSIS — M1712 Unilateral primary osteoarthritis, left knee: Secondary | ICD-10-CM | POA: Diagnosis not present

## 2021-05-15 DIAGNOSIS — M1712 Unilateral primary osteoarthritis, left knee: Secondary | ICD-10-CM | POA: Diagnosis not present

## 2021-05-23 DIAGNOSIS — S83282A Other tear of lateral meniscus, current injury, left knee, initial encounter: Secondary | ICD-10-CM | POA: Diagnosis not present

## 2021-05-23 DIAGNOSIS — M1712 Unilateral primary osteoarthritis, left knee: Secondary | ICD-10-CM | POA: Diagnosis not present

## 2021-05-23 DIAGNOSIS — G8918 Other acute postprocedural pain: Secondary | ICD-10-CM | POA: Diagnosis not present

## 2021-05-23 DIAGNOSIS — S83242A Other tear of medial meniscus, current injury, left knee, initial encounter: Secondary | ICD-10-CM | POA: Diagnosis not present

## 2021-05-23 DIAGNOSIS — S83232A Complex tear of medial meniscus, current injury, left knee, initial encounter: Secondary | ICD-10-CM | POA: Diagnosis not present

## 2021-05-23 DIAGNOSIS — S83272A Complex tear of lateral meniscus, current injury, left knee, initial encounter: Secondary | ICD-10-CM | POA: Diagnosis not present

## 2021-05-28 DIAGNOSIS — M25562 Pain in left knee: Secondary | ICD-10-CM | POA: Diagnosis not present

## 2021-05-28 DIAGNOSIS — S83242D Other tear of medial meniscus, current injury, left knee, subsequent encounter: Secondary | ICD-10-CM | POA: Diagnosis not present

## 2021-05-28 DIAGNOSIS — M25662 Stiffness of left knee, not elsewhere classified: Secondary | ICD-10-CM | POA: Diagnosis not present

## 2021-05-28 DIAGNOSIS — M6281 Muscle weakness (generalized): Secondary | ICD-10-CM | POA: Diagnosis not present

## 2021-05-28 DIAGNOSIS — S83282D Other tear of lateral meniscus, current injury, left knee, subsequent encounter: Secondary | ICD-10-CM | POA: Diagnosis not present

## 2021-05-31 ENCOUNTER — Other Ambulatory Visit: Payer: Self-pay

## 2021-05-31 ENCOUNTER — Other Ambulatory Visit (HOSPITAL_COMMUNITY): Payer: Self-pay | Admitting: Orthopedic Surgery

## 2021-05-31 ENCOUNTER — Ambulatory Visit (HOSPITAL_COMMUNITY)
Admission: RE | Admit: 2021-05-31 | Discharge: 2021-05-31 | Disposition: A | Discharge status: Home / Self Care | Payer: Medicare Other | Source: Ambulatory Visit | Attending: Orthopedic Surgery | Admitting: Orthopedic Surgery

## 2021-05-31 DIAGNOSIS — M79605 Pain in left leg: Secondary | ICD-10-CM | POA: Insufficient documentation

## 2021-05-31 DIAGNOSIS — M7989 Other specified soft tissue disorders: Secondary | ICD-10-CM | POA: Insufficient documentation

## 2021-05-31 DIAGNOSIS — M25562 Pain in left knee: Secondary | ICD-10-CM | POA: Diagnosis not present

## 2021-06-05 DIAGNOSIS — M25562 Pain in left knee: Secondary | ICD-10-CM | POA: Diagnosis not present

## 2021-06-05 DIAGNOSIS — M6281 Muscle weakness (generalized): Secondary | ICD-10-CM | POA: Diagnosis not present

## 2021-06-05 DIAGNOSIS — M25662 Stiffness of left knee, not elsewhere classified: Secondary | ICD-10-CM | POA: Diagnosis not present

## 2021-06-05 DIAGNOSIS — S83282D Other tear of lateral meniscus, current injury, left knee, subsequent encounter: Secondary | ICD-10-CM | POA: Diagnosis not present

## 2021-06-05 DIAGNOSIS — S83242D Other tear of medial meniscus, current injury, left knee, subsequent encounter: Secondary | ICD-10-CM | POA: Diagnosis not present

## 2021-06-12 DIAGNOSIS — M25662 Stiffness of left knee, not elsewhere classified: Secondary | ICD-10-CM | POA: Diagnosis not present

## 2021-06-12 DIAGNOSIS — S83242D Other tear of medial meniscus, current injury, left knee, subsequent encounter: Secondary | ICD-10-CM | POA: Diagnosis not present

## 2021-06-12 DIAGNOSIS — S83282D Other tear of lateral meniscus, current injury, left knee, subsequent encounter: Secondary | ICD-10-CM | POA: Diagnosis not present

## 2021-06-12 DIAGNOSIS — M6281 Muscle weakness (generalized): Secondary | ICD-10-CM | POA: Diagnosis not present

## 2021-06-12 DIAGNOSIS — M25562 Pain in left knee: Secondary | ICD-10-CM | POA: Diagnosis not present

## 2021-06-19 DIAGNOSIS — M25662 Stiffness of left knee, not elsewhere classified: Secondary | ICD-10-CM | POA: Diagnosis not present

## 2021-06-19 DIAGNOSIS — S83242D Other tear of medial meniscus, current injury, left knee, subsequent encounter: Secondary | ICD-10-CM | POA: Diagnosis not present

## 2021-06-19 DIAGNOSIS — S83282D Other tear of lateral meniscus, current injury, left knee, subsequent encounter: Secondary | ICD-10-CM | POA: Diagnosis not present

## 2021-06-19 DIAGNOSIS — M6281 Muscle weakness (generalized): Secondary | ICD-10-CM | POA: Diagnosis not present

## 2021-06-19 DIAGNOSIS — M25562 Pain in left knee: Secondary | ICD-10-CM | POA: Diagnosis not present

## 2021-06-26 DIAGNOSIS — S83242D Other tear of medial meniscus, current injury, left knee, subsequent encounter: Secondary | ICD-10-CM | POA: Diagnosis not present

## 2021-06-26 DIAGNOSIS — S83282D Other tear of lateral meniscus, current injury, left knee, subsequent encounter: Secondary | ICD-10-CM | POA: Diagnosis not present

## 2021-06-26 DIAGNOSIS — M25662 Stiffness of left knee, not elsewhere classified: Secondary | ICD-10-CM | POA: Diagnosis not present

## 2021-06-26 DIAGNOSIS — M6281 Muscle weakness (generalized): Secondary | ICD-10-CM | POA: Diagnosis not present

## 2021-06-26 DIAGNOSIS — M25562 Pain in left knee: Secondary | ICD-10-CM | POA: Diagnosis not present

## 2021-07-02 DIAGNOSIS — H2511 Age-related nuclear cataract, right eye: Secondary | ICD-10-CM | POA: Diagnosis not present

## 2021-07-02 DIAGNOSIS — H2513 Age-related nuclear cataract, bilateral: Secondary | ICD-10-CM | POA: Diagnosis not present

## 2021-07-03 DIAGNOSIS — H2512 Age-related nuclear cataract, left eye: Secondary | ICD-10-CM | POA: Diagnosis not present

## 2021-07-23 DIAGNOSIS — H2513 Age-related nuclear cataract, bilateral: Secondary | ICD-10-CM | POA: Diagnosis not present

## 2021-07-23 DIAGNOSIS — H52202 Unspecified astigmatism, left eye: Secondary | ICD-10-CM | POA: Diagnosis not present

## 2021-07-23 DIAGNOSIS — H2512 Age-related nuclear cataract, left eye: Secondary | ICD-10-CM | POA: Diagnosis not present

## 2021-07-30 DIAGNOSIS — S83282D Other tear of lateral meniscus, current injury, left knee, subsequent encounter: Secondary | ICD-10-CM | POA: Diagnosis not present

## 2021-07-30 DIAGNOSIS — S83242D Other tear of medial meniscus, current injury, left knee, subsequent encounter: Secondary | ICD-10-CM | POA: Diagnosis not present

## 2021-11-20 DIAGNOSIS — E669 Obesity, unspecified: Secondary | ICD-10-CM | POA: Diagnosis not present

## 2021-11-20 DIAGNOSIS — F4010 Social phobia, unspecified: Secondary | ICD-10-CM | POA: Diagnosis not present

## 2021-11-20 DIAGNOSIS — I10 Essential (primary) hypertension: Secondary | ICD-10-CM | POA: Diagnosis not present

## 2021-11-20 DIAGNOSIS — E785 Hyperlipidemia, unspecified: Secondary | ICD-10-CM | POA: Diagnosis not present

## 2021-11-20 DIAGNOSIS — Z0000 Encounter for general adult medical examination without abnormal findings: Secondary | ICD-10-CM | POA: Diagnosis not present

## 2021-11-20 DIAGNOSIS — M25473 Effusion, unspecified ankle: Secondary | ICD-10-CM | POA: Diagnosis not present

## 2021-11-20 DIAGNOSIS — L309 Dermatitis, unspecified: Secondary | ICD-10-CM | POA: Diagnosis not present

## 2021-11-20 DIAGNOSIS — M8588 Other specified disorders of bone density and structure, other site: Secondary | ICD-10-CM | POA: Diagnosis not present

## 2022-03-27 DIAGNOSIS — Z23 Encounter for immunization: Secondary | ICD-10-CM | POA: Diagnosis not present

## 2022-05-23 DIAGNOSIS — E785 Hyperlipidemia, unspecified: Secondary | ICD-10-CM | POA: Diagnosis not present

## 2022-05-23 DIAGNOSIS — F4010 Social phobia, unspecified: Secondary | ICD-10-CM | POA: Diagnosis not present

## 2022-05-23 DIAGNOSIS — I10 Essential (primary) hypertension: Secondary | ICD-10-CM | POA: Diagnosis not present

## 2022-05-23 DIAGNOSIS — E669 Obesity, unspecified: Secondary | ICD-10-CM | POA: Diagnosis not present

## 2022-08-22 DIAGNOSIS — H40011 Open angle with borderline findings, low risk, right eye: Secondary | ICD-10-CM | POA: Diagnosis not present

## 2022-08-29 DIAGNOSIS — M25561 Pain in right knee: Secondary | ICD-10-CM | POA: Diagnosis not present

## 2022-09-03 DIAGNOSIS — M25561 Pain in right knee: Secondary | ICD-10-CM | POA: Diagnosis not present

## 2022-09-08 IMAGING — MG MM DIGITAL SCREENING BILAT W/ TOMO AND CAD
8 series · 8 of 24 positions shown · non-contrast
Comparison: Previous exam(s).

CLINICAL DATA: Screening.

EXAM:
DIGITAL SCREENING BILATERAL MAMMOGRAM WITH TOMOSYNTHESIS AND CAD
TECHNIQUE: Bilateral screening digital craniocaudal and mediolateral oblique
mammograms were obtained. Bilateral screening digital breast
tomosynthesis was performed. The images were evaluated with
computer-aided detection.

[R MLO synth-2D]
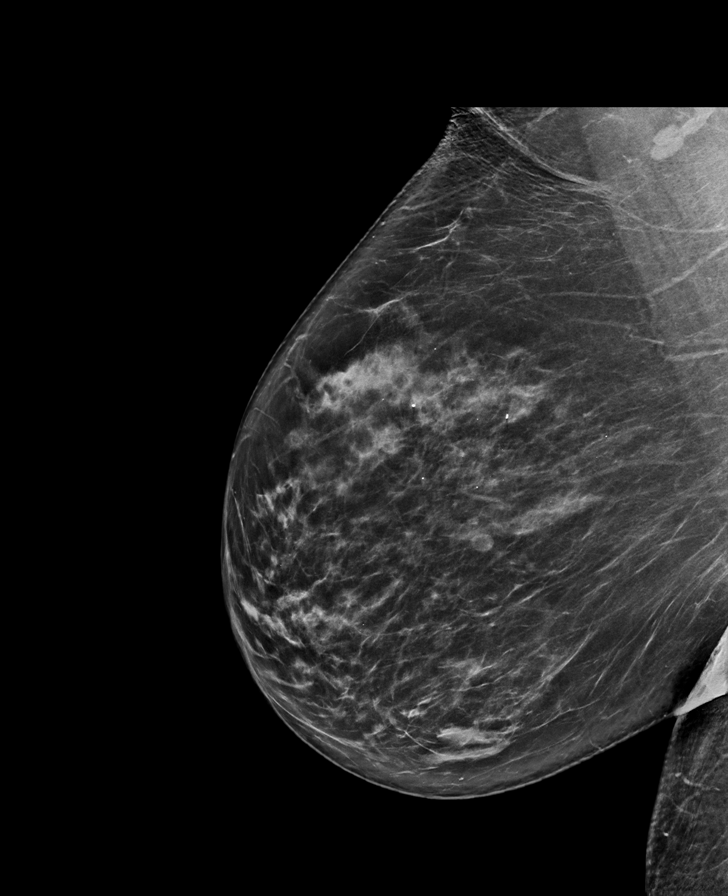

[R CC synth-2D]
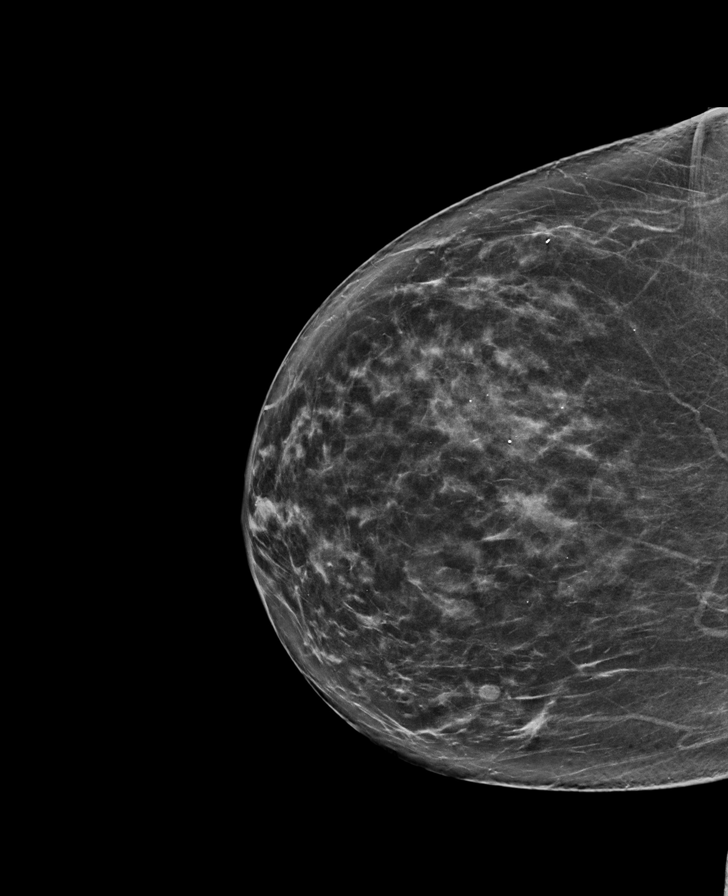

[L CC synth-2D]
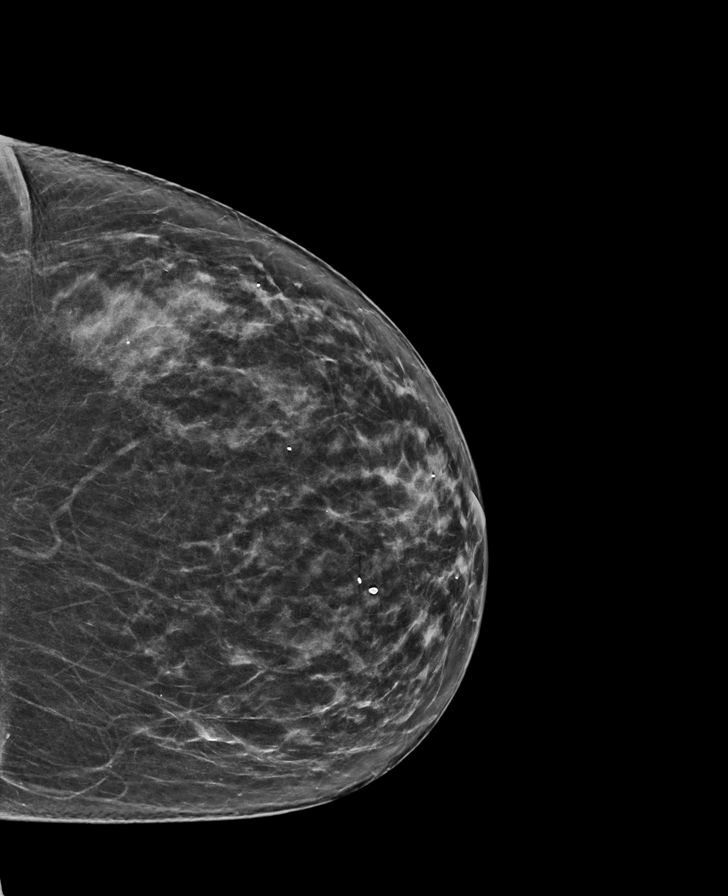

[L MLO synth-2D]
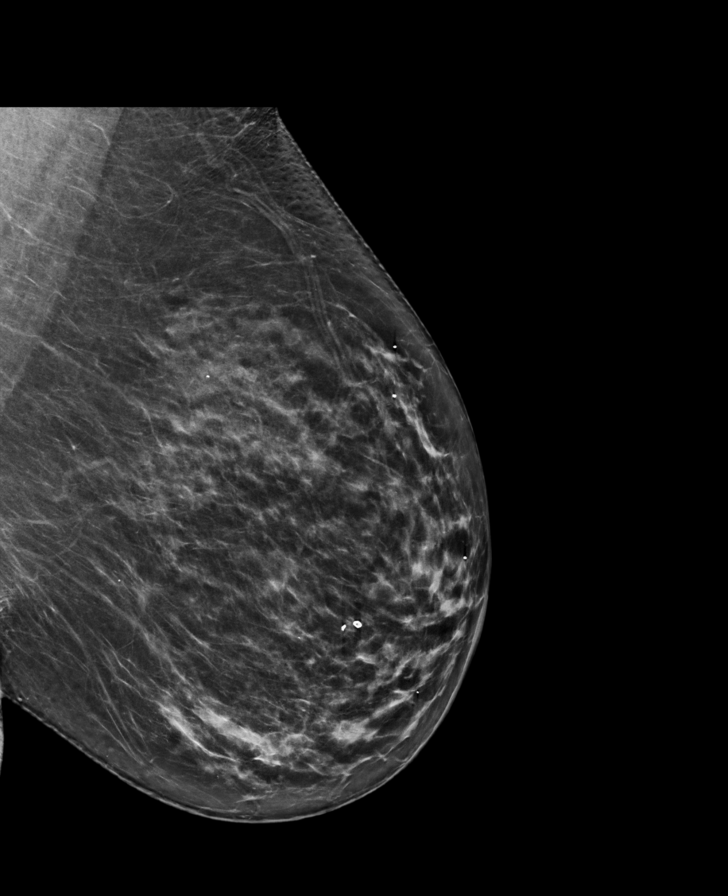

[L MLO tomo · tomo slice 39/77.0]
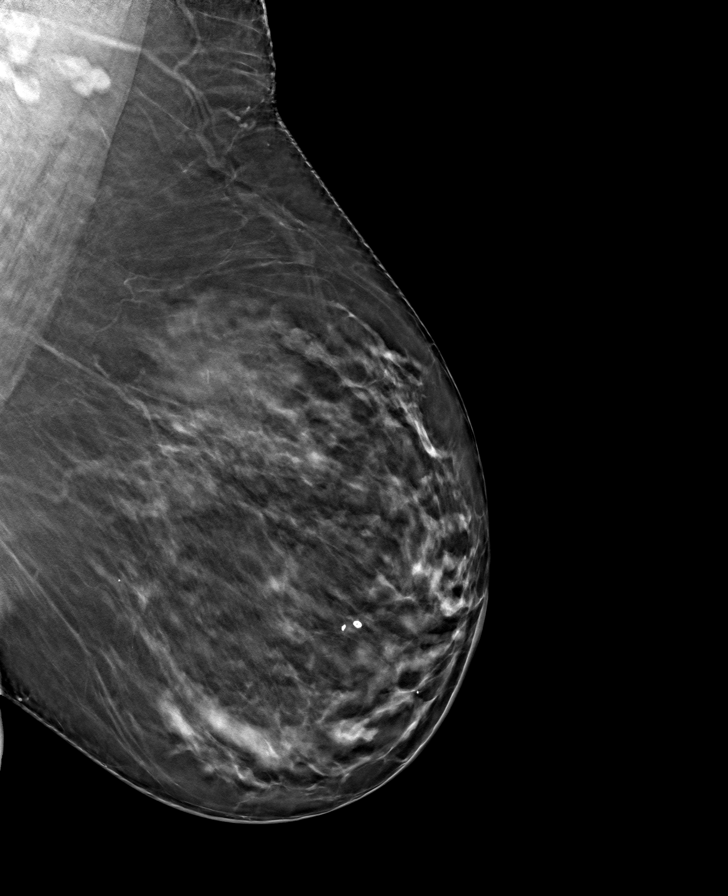

[R CC tomo · tomo slice 37/73.0]
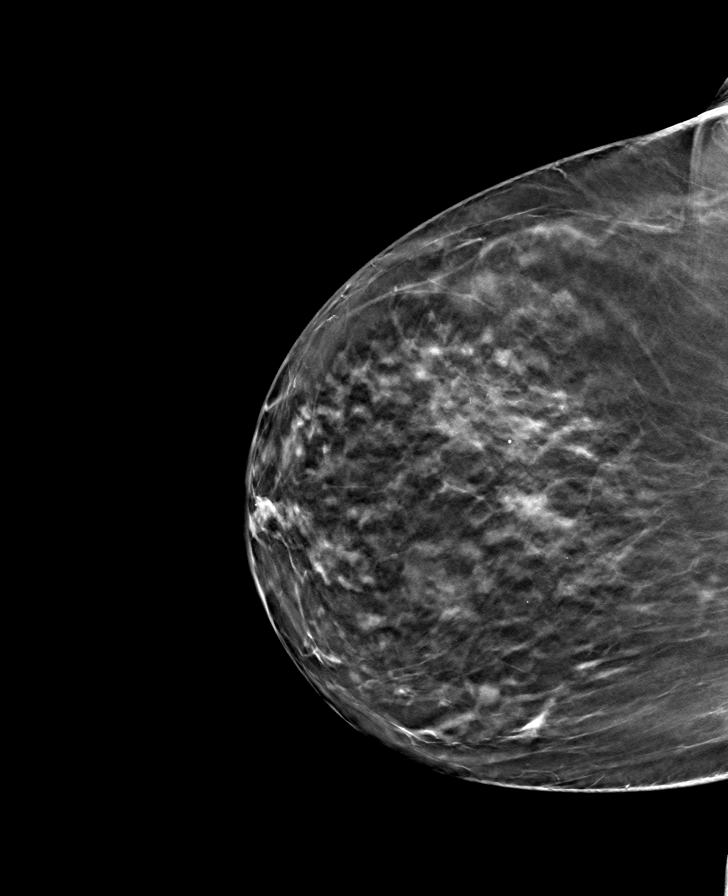

[L CC tomo · tomo slice 35/69.0]
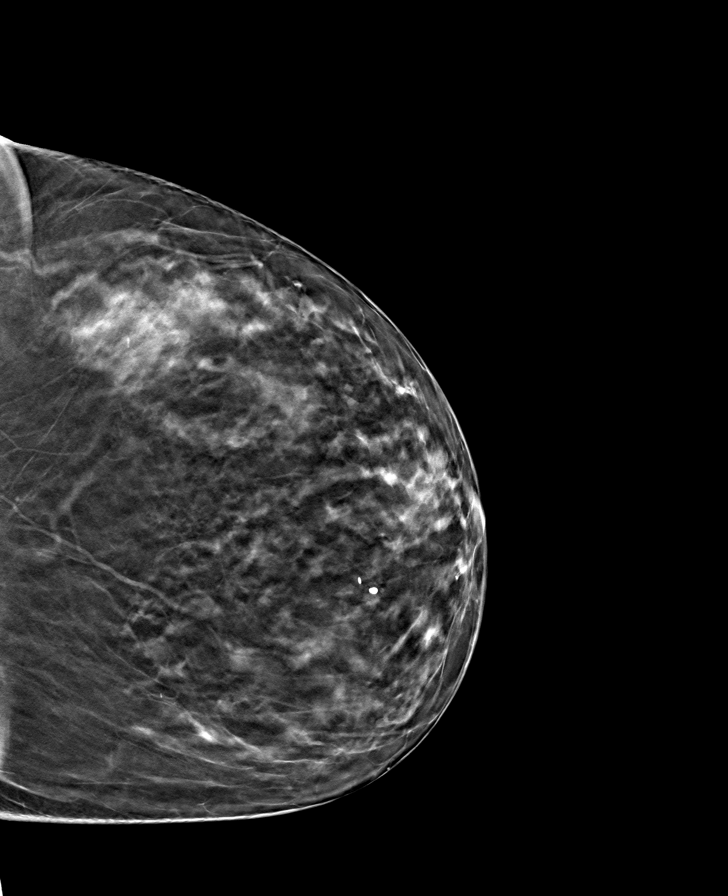

[R MLO tomo · tomo slice 41/81.0]
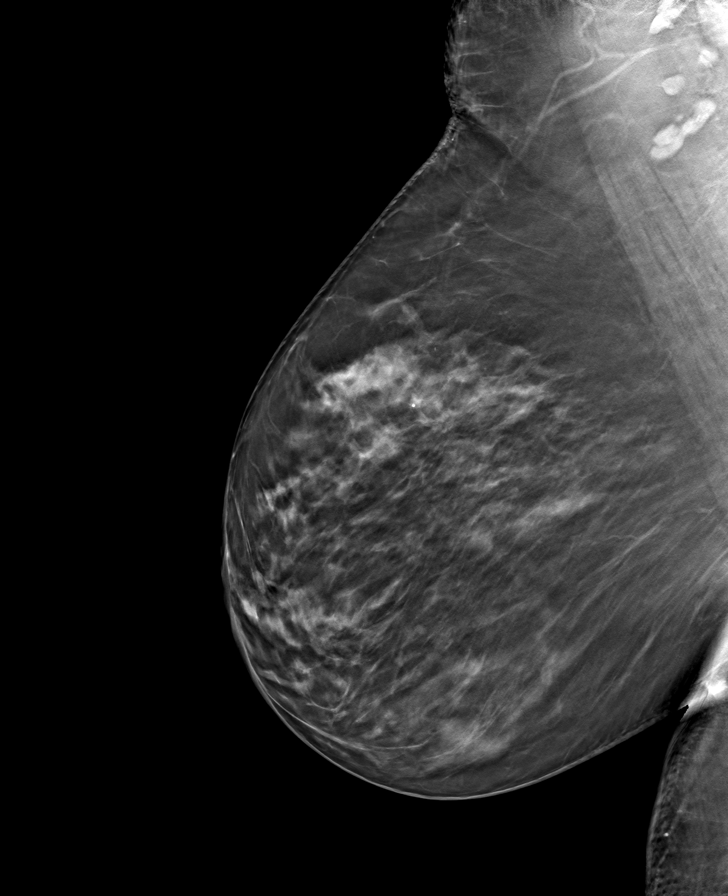

[8 of 24 positions shown; findings below may reference images not displayed]

ACR Breast Density Category b: There are scattered areas of
fibroglandular density.
FINDINGS: There are no findings suspicious for malignancy.
IMPRESSION: No mammographic evidence of malignancy. A result letter of this
screening mammogram will be mailed directly to the patient.

RECOMMENDATION:
Screening mammogram in one year. (Code:51-O-LD2)

BI-RADS CATEGORY  1: Negative.

## 2022-09-10 DIAGNOSIS — M25561 Pain in right knee: Secondary | ICD-10-CM | POA: Diagnosis not present

## 2022-09-13 DIAGNOSIS — M25561 Pain in right knee: Secondary | ICD-10-CM | POA: Diagnosis not present

## 2022-09-16 DIAGNOSIS — M65861 Other synovitis and tenosynovitis, right lower leg: Secondary | ICD-10-CM | POA: Diagnosis not present

## 2022-09-16 DIAGNOSIS — S83231A Complex tear of medial meniscus, current injury, right knee, initial encounter: Secondary | ICD-10-CM | POA: Diagnosis not present

## 2022-09-16 DIAGNOSIS — S83241A Other tear of medial meniscus, current injury, right knee, initial encounter: Secondary | ICD-10-CM | POA: Diagnosis not present

## 2022-09-23 DIAGNOSIS — M25661 Stiffness of right knee, not elsewhere classified: Secondary | ICD-10-CM | POA: Diagnosis not present

## 2022-09-23 DIAGNOSIS — S83231D Complex tear of medial meniscus, current injury, right knee, subsequent encounter: Secondary | ICD-10-CM | POA: Diagnosis not present

## 2022-09-23 DIAGNOSIS — M6281 Muscle weakness (generalized): Secondary | ICD-10-CM | POA: Diagnosis not present

## 2022-09-23 DIAGNOSIS — R262 Difficulty in walking, not elsewhere classified: Secondary | ICD-10-CM | POA: Diagnosis not present

## 2022-09-30 DIAGNOSIS — S83231D Complex tear of medial meniscus, current injury, right knee, subsequent encounter: Secondary | ICD-10-CM | POA: Diagnosis not present

## 2022-09-30 DIAGNOSIS — M6281 Muscle weakness (generalized): Secondary | ICD-10-CM | POA: Diagnosis not present

## 2022-09-30 DIAGNOSIS — M25661 Stiffness of right knee, not elsewhere classified: Secondary | ICD-10-CM | POA: Diagnosis not present

## 2022-09-30 DIAGNOSIS — R262 Difficulty in walking, not elsewhere classified: Secondary | ICD-10-CM | POA: Diagnosis not present

## 2022-10-08 DIAGNOSIS — S83231D Complex tear of medial meniscus, current injury, right knee, subsequent encounter: Secondary | ICD-10-CM | POA: Diagnosis not present

## 2022-10-08 DIAGNOSIS — M6281 Muscle weakness (generalized): Secondary | ICD-10-CM | POA: Diagnosis not present

## 2022-10-08 DIAGNOSIS — M25661 Stiffness of right knee, not elsewhere classified: Secondary | ICD-10-CM | POA: Diagnosis not present

## 2022-10-08 DIAGNOSIS — R262 Difficulty in walking, not elsewhere classified: Secondary | ICD-10-CM | POA: Diagnosis not present

## 2022-10-14 DIAGNOSIS — R262 Difficulty in walking, not elsewhere classified: Secondary | ICD-10-CM | POA: Diagnosis not present

## 2022-10-14 DIAGNOSIS — S83231D Complex tear of medial meniscus, current injury, right knee, subsequent encounter: Secondary | ICD-10-CM | POA: Diagnosis not present

## 2022-10-14 DIAGNOSIS — M6281 Muscle weakness (generalized): Secondary | ICD-10-CM | POA: Diagnosis not present

## 2022-10-14 DIAGNOSIS — M25661 Stiffness of right knee, not elsewhere classified: Secondary | ICD-10-CM | POA: Diagnosis not present

## 2022-11-05 DIAGNOSIS — R197 Diarrhea, unspecified: Secondary | ICD-10-CM | POA: Diagnosis not present

## 2022-11-28 ENCOUNTER — Other Ambulatory Visit: Payer: Self-pay | Admitting: Family Medicine

## 2022-11-28 DIAGNOSIS — M8588 Other specified disorders of bone density and structure, other site: Secondary | ICD-10-CM | POA: Diagnosis not present

## 2022-11-28 DIAGNOSIS — R194 Change in bowel habit: Secondary | ICD-10-CM | POA: Diagnosis not present

## 2022-11-28 DIAGNOSIS — K429 Umbilical hernia without obstruction or gangrene: Secondary | ICD-10-CM | POA: Diagnosis not present

## 2022-11-28 DIAGNOSIS — L900 Lichen sclerosus et atrophicus: Secondary | ICD-10-CM | POA: Diagnosis not present

## 2022-11-28 DIAGNOSIS — E669 Obesity, unspecified: Secondary | ICD-10-CM | POA: Diagnosis not present

## 2022-11-28 DIAGNOSIS — I10 Essential (primary) hypertension: Secondary | ICD-10-CM | POA: Diagnosis not present

## 2022-11-28 DIAGNOSIS — F4010 Social phobia, unspecified: Secondary | ICD-10-CM | POA: Diagnosis not present

## 2022-11-28 DIAGNOSIS — Z0000 Encounter for general adult medical examination without abnormal findings: Secondary | ICD-10-CM | POA: Diagnosis not present

## 2022-11-28 DIAGNOSIS — E785 Hyperlipidemia, unspecified: Secondary | ICD-10-CM | POA: Diagnosis not present

## 2022-11-28 DIAGNOSIS — L309 Dermatitis, unspecified: Secondary | ICD-10-CM | POA: Diagnosis not present

## 2022-11-28 DIAGNOSIS — H9193 Unspecified hearing loss, bilateral: Secondary | ICD-10-CM | POA: Diagnosis not present

## 2022-12-12 DIAGNOSIS — K436 Other and unspecified ventral hernia with obstruction, without gangrene: Secondary | ICD-10-CM | POA: Diagnosis not present

## 2022-12-17 ENCOUNTER — Other Ambulatory Visit: Payer: Self-pay | Admitting: General Surgery

## 2022-12-17 DIAGNOSIS — R1900 Intra-abdominal and pelvic swelling, mass and lump, unspecified site: Secondary | ICD-10-CM

## 2022-12-30 DIAGNOSIS — H903 Sensorineural hearing loss, bilateral: Secondary | ICD-10-CM | POA: Diagnosis not present

## 2023-01-03 ENCOUNTER — Ambulatory Visit: Admission: RE | Admit: 2023-01-03 | Payer: Medicare Other | Source: Ambulatory Visit

## 2023-01-03 DIAGNOSIS — R1900 Intra-abdominal and pelvic swelling, mass and lump, unspecified site: Secondary | ICD-10-CM

## 2023-01-03 DIAGNOSIS — K449 Diaphragmatic hernia without obstruction or gangrene: Secondary | ICD-10-CM | POA: Diagnosis not present

## 2023-01-03 DIAGNOSIS — K469 Unspecified abdominal hernia without obstruction or gangrene: Secondary | ICD-10-CM | POA: Diagnosis not present

## 2023-01-03 DIAGNOSIS — N289 Disorder of kidney and ureter, unspecified: Secondary | ICD-10-CM | POA: Diagnosis not present

## 2023-01-03 DIAGNOSIS — K5730 Diverticulosis of large intestine without perforation or abscess without bleeding: Secondary | ICD-10-CM | POA: Diagnosis not present

## 2023-02-12 NOTE — Progress Notes (Signed)
Sent message, via epic in basket, requesting orders in epic from surgeon.  

## 2023-02-13 NOTE — Patient Instructions (Addendum)
DUE TO COVID-19 ONLY TWO VISITORS  (aged 78 and older)  ARE ALLOWED TO COME WITH YOU AND STAY IN THE WAITING ROOM ONLY DURING PRE OP AND PROCEDURE.   **NO VISITORS ARE ALLOWED IN THE SHORT STAY AREA OR RECOVERY ROOM!!**  IF YOU WILL BE ADMITTED INTO THE HOSPITAL YOU ARE ALLOWED ONLY FOUR SUPPORT PEOPLE DURING VISITATION HOURS ONLY (7 AM -8PM)   The support person(s) must pass our screening, gel in and out, and wear a mask at all times, including in the patient's room. Patients must also wear a mask when staff or their support person are in the room. Visitors GUEST BADGE MUST BE WORN VISIBLY  One adult visitor may remain with you overnight and MUST be in the room by 8 P.M.     Your procedure is scheduled on: 02/28/23   Report to [facility] Main Entrance    Report to admitting at 1:15 PM   Call this number if you have problems the morning of surgery [phone] Eat a light diet the day before surgery.  Examples including soups, broths, toast, yogurt, mashed potatoes.  Things to avoid include carbonated beverages (fizzy beverages), raw fruits and raw vegetables, or beans.   If your bowels are filled with gas, your surgeon will have difficulty visualizing your pelvic organs which increases your surgical risks.Eat a light diet the day before surgery.  Examples including soups, broths, toast, yogurt, mashed potatoes.  Things to avoid include carbonated beverages (fizzy beverages), raw fruits and raw vegetables, or beans.   If your bowels are filled with gas, your surgeon will have difficulty visualizing your pelvic organs which increases your surgical risks.   Do not eat food :After Midnight.   After Midnight you may have the following liquids until : 12:30 PM DAY OF SURGERY  Water Black Coffee (sugar ok, NO MILK/CREAM OR CREAMERS)  Tea (sugar ok, NO MILK/CREAM OR CREAMERS) regular and decaf                             Plain Jell-O (NO RED)                                            Fruit ices (not with fruit pulp, NO RED)                                     Popsicles (NO RED)                                                                  Juice: apple, WHITE grape, WHITE cranberry Sports drinks like Gatorade (NO RED)              FOLLOW ANY ADDITIONAL PRE OP INSTRUCTIONS YOU RECEIVED FROM YOUR SURGEON'S OFFICE!!!   Oral Hygiene is also important to reduce your risk of infection.  Remember - BRUSH YOUR TEETH THE MORNING OF SURGERY WITH YOUR REGULAR TOOTHPASTE  DENTURES WILL BE REMOVED PRIOR TO SURGERY PLEASE DO NOT APPLY "Poly grip" OR ADHESIVES!!!   Do NOT smoke after Midnight   Take these medicines the morning of surgery with A SIP OF WATER: fexofenadine,omeprazole.                              You may not have any metal on your body including hair pins, jewelry, and body piercing             Do not wear make-up, lotions, powders, perfumes/cologne, or deodorant  Do not wear nail polish including gel and S&S, artificial/acrylic nails, or any other type of covering on natural nails including finger and toenails. If you have artificial nails, gel coating, etc. that needs to be removed by a nail salon please have this removed prior to surgery or surgery may need to be canceled/ delayed if the surgeon/ anesthesia feels like they are unable to be safely monitored.   Do not shave  48 hours prior to surgery.    Do not bring valuables to the hospital. [facility] IS NOT             RESPONSIBLE   FOR VALUABLES.   Contacts, glasses, or bridgework may not be worn into surgery.   Bring small overnight bag day of surgery.   DO NOT BRING YOUR HOME MEDICATIONS TO THE HOSPITAL. PHARMACY WILL DISPENSE MEDICATIONS LISTED ON YOUR MEDICATION LIST TO YOU DURING YOUR ADMISSION IN THE HOSPITAL!    Patients discharged on the day of surgery will not be allowed to drive home.  Someone NEEDS to stay with you for the first 24 hours after  anesthesia.   Special Instructions: Bring a copy of your healthcare power of attorney and living will documents         the day of surgery if you haven't scanned them before.              Please read over the following fact sheets you were given: IF YOU HAVE QUESTIONS ABOUT YOUR PRE-OP INSTRUCTIONS PLEASE CALL [phone]    [facility] - Preparing for Surgery Before surgery, you can play an important role.  Because skin is not sterile, your skin needs to be as free of germs as possible.  You can reduce the number of germs on your skin by washing with CHG (chlorahexidine gluconate) soap before surgery.  CHG is an antiseptic cleaner which kills germs and bonds with the skin to continue killing germs even after washing. Please DO NOT use if you have an allergy to CHG or antibacterial soaps.  If your skin becomes reddened/irritated stop using the CHG and inform your nurse when you arrive at Short Stay. Do not shave (including legs and underarms) for at least 48 hours prior to the first CHG shower.  You may shave your face/neck. Please follow these instructions carefully:  1.  Shower with CHG Soap the night before surgery and the  morning of Surgery.  2.  If you choose to wash your hair, wash your hair first as usual with your  normal  shampoo.  3.  After you shampoo, rinse your hair and body thoroughly to remove the  shampoo.                           4.  Use CHG as you would any other liquid soap.  You can apply chg directly  to the skin and wash                       Gently with a scrungie or clean washcloth.  5.  Apply the CHG Soap to your body ONLY FROM THE NECK DOWN.   Do not use on face/ open                           Wound or open sores. Avoid contact with eyes, ears mouth and genitals (private parts).                       Wash face,  Genitals (private parts) with your normal soap.             6.  Wash thoroughly, paying special attention to the area where your surgery  will be  performed.  7.  Thoroughly rinse your body with warm water from the neck down.  8.  DO NOT shower/wash with your normal soap after using and rinsing off  the CHG Soap.                9.  Pat yourself dry with a clean towel.            10.  Wear clean pajamas.            11.  Place clean sheets on your bed the night of your first shower and do not  sleep with pets. Day of Surgery : Do not apply any lotions/deodorants the morning of surgery.  Please wear clean clothes to the hospital/surgery center.  FAILURE TO FOLLOW THESE INSTRUCTIONS MAY RESULT IN THE CANCELLATION OF YOUR SURGERY PATIENT SIGNATURE_________________________________  NURSE SIGNATURE__________________________________  ________________________________________________________________________

## 2023-02-14 ENCOUNTER — Other Ambulatory Visit: Payer: Self-pay

## 2023-02-14 ENCOUNTER — Other Ambulatory Visit: Payer: Self-pay | Admitting: Family Medicine

## 2023-02-14 ENCOUNTER — Encounter (HOSPITAL_COMMUNITY)
Admission: RE | Admit: 2023-02-14 | Discharge: 2023-02-14 | Disposition: A | Discharge status: Home / Self Care | Payer: Medicare Other | Source: Ambulatory Visit | Attending: General Surgery

## 2023-02-14 ENCOUNTER — Encounter (HOSPITAL_COMMUNITY): Payer: Self-pay

## 2023-02-14 VITALS — BP 158/88 | HR 84 | Temp 98.7°F | Ht 63.5 in | Wt 185.0 lb

## 2023-02-14 DIAGNOSIS — I10 Essential (primary) hypertension: Secondary | ICD-10-CM | POA: Diagnosis not present

## 2023-02-14 DIAGNOSIS — Z01818 Encounter for other preprocedural examination: Secondary | ICD-10-CM | POA: Insufficient documentation

## 2023-02-14 DIAGNOSIS — Z1231 Encounter for screening mammogram for malignant neoplasm of breast: Secondary | ICD-10-CM

## 2023-02-14 HISTORY — DX: Anemia, unspecified: D64.9

## 2023-02-14 HISTORY — DX: Anxiety disorder, unspecified: F41.9

## 2023-02-14 HISTORY — DX: Unspecified osteoarthritis, unspecified site: M19.90

## 2023-02-14 LAB — BASIC METABOLIC PANEL
Anion gap: 9 (ref 5–15)
BUN: 20 mg/dL (ref 8–23)
CO2: 26 mmol/L (ref 22–32)
Calcium: 9.2 mg/dL (ref 8.9–10.3)
Chloride: 105 mmol/L (ref 98–111)
Creatinine, Ser: 1 mg/dL (ref 0.44–1.00)
GFR, Estimated: 58 mL/min — ABNORMAL LOW (ref >60)
Glucose, Bld: 97 mg/dL (ref 70–99)
Potassium: 3.9 mmol/L (ref 3.5–5.1)
Sodium: 140 mmol/L (ref 135–145)

## 2023-02-14 LAB — CBC
HCT: 41.7 % (ref 36.0–46.0)
Hemoglobin: 13.3 g/dL (ref 12.0–15.0)
MCH: 29.4 pg (ref 26.0–34.0)
MCHC: 31.9 g/dL (ref 30.0–36.0)
MCV: 92.1 fL (ref 80.0–100.0)
Platelets: 344 10*3/uL (ref 150–400)
RBC: 4.53 MIL/uL (ref 3.87–5.11)
RDW: 12.6 % (ref 11.5–15.5)
WBC: 9.1 10*3/uL (ref 4.0–10.5)
nRBC: 0 % (ref 0.0–0.2)

## 2023-02-14 NOTE — Progress Notes (Addendum)
For Short Stay: COVID SWAB appointment date:  Bowel Prep reminder:   For Anesthesia: PCP - Laurann Montana, MD  Cardiologist - N/A  Chest x-ray -  EKG - 02/14/23 Stress Test -  ECHO -  Cardiac Cath -  Pacemaker/ICD device last checked: Pacemaker orders received: Device Rep notified:  Spinal Cord Stimulator:  Sleep Study - N/A CPAP -   Fasting Blood Sugar - N/A Checks Blood Sugar _____ times a day Date and result of last Hgb A1c-  Last dose of GLP1 agonist- N/A GLP1 instructions:   Last dose of SGLT-2 inhibitors- N/A SGLT-2 instructions:   Blood Thinner Instructions: N/A Aspirin Instructions: Does not take it. Last Dose:  Activity level: Can go up a flight of stairs and activities of daily living without stopping and without chest pain and/or shortness of breath   Able to exercise without chest pain and/or shortness of breath  Anesthesia review: Hx: HTN,CKD III,palpitations(30 years ago).  Patient denies shortness of breath, fever, cough and chest pain at PAT appointment   Patient verbalized understanding of instructions that were given to them at the PAT appointment. Patient was also instructed that they will need to review over the PAT instructions again at home before surgery.

## 2023-02-18 ENCOUNTER — Ambulatory Visit: Payer: Self-pay | Admitting: General Surgery

## 2023-02-28 ENCOUNTER — Encounter (HOSPITAL_COMMUNITY)
Admission: RE | Disposition: A | Discharge status: Home / Self Care | Payer: Self-pay | Source: Home / Self Care | Attending: General Surgery

## 2023-02-28 ENCOUNTER — Other Ambulatory Visit: Payer: Self-pay

## 2023-02-28 ENCOUNTER — Ambulatory Visit (HOSPITAL_COMMUNITY): Payer: Medicare Other | Admitting: Anesthesiology

## 2023-02-28 ENCOUNTER — Observation Stay (HOSPITAL_COMMUNITY)
Admission: RE | Admit: 2023-02-28 | Discharge: 2023-03-01 | Disposition: A | Discharge status: Home / Self Care | Payer: Medicare Other | Attending: General Surgery | Admitting: General Surgery

## 2023-02-28 ENCOUNTER — Encounter (HOSPITAL_COMMUNITY): Payer: Self-pay | Admitting: General Surgery

## 2023-02-28 ENCOUNTER — Ambulatory Visit (HOSPITAL_BASED_OUTPATIENT_CLINIC_OR_DEPARTMENT_OTHER): Payer: Medicare Other | Admitting: Anesthesiology

## 2023-02-28 DIAGNOSIS — N189 Chronic kidney disease, unspecified: Secondary | ICD-10-CM | POA: Insufficient documentation

## 2023-02-28 DIAGNOSIS — K436 Other and unspecified ventral hernia with obstruction, without gangrene: Secondary | ICD-10-CM | POA: Diagnosis not present

## 2023-02-28 DIAGNOSIS — K420 Umbilical hernia with obstruction, without gangrene: Secondary | ICD-10-CM | POA: Diagnosis not present

## 2023-02-28 DIAGNOSIS — K429 Umbilical hernia without obstruction or gangrene: Secondary | ICD-10-CM | POA: Diagnosis not present

## 2023-02-28 DIAGNOSIS — Z8719 Personal history of other diseases of the digestive system: Principal | ICD-10-CM

## 2023-02-28 DIAGNOSIS — I129 Hypertensive chronic kidney disease with stage 1 through stage 4 chronic kidney disease, or unspecified chronic kidney disease: Secondary | ICD-10-CM | POA: Diagnosis not present

## 2023-02-28 HISTORY — PX: VENTRAL HERNIA REPAIR: SHX424

## 2023-02-28 SURGERY — REPAIR, HERNIA, VENTRAL, LAPAROSCOPIC
Anesthesia: General

## 2023-02-28 MED ORDER — DEXAMETHASONE SODIUM PHOSPHATE 10 MG/ML IJ SOLN
INTRAMUSCULAR | Status: AC
  Filled 2023-02-28: qty 1

## 2023-02-28 MED ORDER — OXYCODONE HCL 5 MG PO TABS: 5.0000 mg | ORAL_TABLET | Freq: Once | ORAL | Status: DC | PRN

## 2023-02-28 MED ORDER — ENOXAPARIN SODIUM 40 MG/0.4ML IJ SOSY
40.0000 mg | PREFILLED_SYRINGE | INTRAMUSCULAR | Status: DC
  Filled 2023-02-28: qty 0.4

## 2023-02-28 MED ORDER — ACETAMINOPHEN 500 MG PO TABS
1000.0000 mg | ORAL_TABLET | ORAL | Status: DC
  Filled 2023-02-28: qty 2

## 2023-02-28 MED ORDER — FENTANYL CITRATE (PF) 100 MCG/2ML IJ SOLN
INTRAMUSCULAR | Status: DC | PRN
  Administered 2023-02-28 (×4): 50 ug via INTRAVENOUS

## 2023-02-28 MED ORDER — FENTANYL CITRATE (PF) 100 MCG/2ML IJ SOLN
INTRAMUSCULAR | Status: AC
  Filled 2023-02-28: qty 2

## 2023-02-28 MED ORDER — ACETAMINOPHEN 500 MG PO TABS
1000.0000 mg | ORAL_TABLET | Freq: Once | ORAL | Status: AC
  Administered 2023-02-28: 1000 mg via ORAL

## 2023-02-28 MED ORDER — ONDANSETRON HCL 4 MG/2ML IJ SOLN: 4.0000 mg | Freq: Once | INTRAMUSCULAR | Status: DC | PRN

## 2023-02-28 MED ORDER — 0.9 % SODIUM CHLORIDE (POUR BTL) OPTIME
TOPICAL | Status: DC | PRN
Start: 2023-02-28 — End: 2023-02-28
  Administered 2023-02-28: 1000 mL

## 2023-02-28 MED ORDER — BUPIVACAINE-EPINEPHRINE 0.25% -1:200000 IJ SOLN
INTRAMUSCULAR | Status: DC | PRN
  Administered 2023-02-28: 8 mL
  Administered 2023-02-28: 30 mL

## 2023-02-28 MED ORDER — OXYCODONE HCL 5 MG PO TABS
5.0000 mg | ORAL_TABLET | ORAL | Status: DC | PRN
  Administered 2023-03-01 (×2): 5 mg via ORAL
  Filled 2023-02-28 (×2): qty 1

## 2023-02-28 MED ORDER — OXYCODONE HCL 5 MG/5ML PO SOLN: 5.0000 mg | Freq: Once | ORAL | Status: DC | PRN

## 2023-02-28 MED ORDER — ROCURONIUM BROMIDE 100 MG/10ML IV SOLN
INTRAVENOUS | Status: DC | PRN
  Administered 2023-02-28: 50 mg via INTRAVENOUS
  Administered 2023-02-28: 10 mg via INTRAVENOUS

## 2023-02-28 MED ORDER — ROCURONIUM BROMIDE 10 MG/ML (PF) SYRINGE
PREFILLED_SYRINGE | INTRAVENOUS | Status: AC
  Filled 2023-02-28: qty 10

## 2023-02-28 MED ORDER — ONDANSETRON HCL 4 MG/2ML IJ SOLN
INTRAMUSCULAR | Status: AC
  Filled 2023-02-28: qty 2

## 2023-02-28 MED ORDER — DIPHENHYDRAMINE HCL 50 MG/ML IJ SOLN: 12.5000 mg | Freq: Four times a day (QID) | INTRAMUSCULAR | Status: DC | PRN

## 2023-02-28 MED ORDER — PANTOPRAZOLE SODIUM 40 MG IV SOLR
40.0000 mg | Freq: Every day | INTRAVENOUS | Status: DC
  Administered 2023-02-28: 40 mg via INTRAVENOUS
  Filled 2023-02-28: qty 10

## 2023-02-28 MED ORDER — SUGAMMADEX SODIUM 200 MG/2ML IV SOLN
INTRAVENOUS | Status: DC | PRN
  Administered 2023-02-28: 100 mg via INTRAVENOUS

## 2023-02-28 MED ORDER — ONDANSETRON 4 MG PO TBDP: 4.0000 mg | ORAL_TABLET | Freq: Four times a day (QID) | ORAL | Status: DC | PRN

## 2023-02-28 MED ORDER — KETOROLAC TROMETHAMINE 30 MG/ML IJ SOLN
INTRAMUSCULAR | Status: DC | PRN
Start: 2023-02-28 — End: 2023-02-28
  Administered 2023-02-28: 30 mg via INTRAVENOUS

## 2023-02-28 MED ORDER — LIDOCAINE HCL (PF) 2 % IJ SOLN
INTRAMUSCULAR | Status: AC
  Filled 2023-02-28: qty 5

## 2023-02-28 MED ORDER — CHLORHEXIDINE GLUCONATE CLOTH 2 % EX PADS: 6.0000 | MEDICATED_PAD | Freq: Once | CUTANEOUS | Status: DC

## 2023-02-28 MED ORDER — KETOROLAC TROMETHAMINE 30 MG/ML IJ SOLN
INTRAMUSCULAR | Status: AC
  Filled 2023-02-28: qty 1

## 2023-02-28 MED ORDER — CEFAZOLIN SODIUM-DEXTROSE 2-4 GM/100ML-% IV SOLN
2.0000 g | INTRAVENOUS | Status: AC
  Administered 2023-02-28: 2 g via INTRAVENOUS
  Filled 2023-02-28: qty 100

## 2023-02-28 MED ORDER — LISINOPRIL 5 MG PO TABS
15.0000 mg | ORAL_TABLET | Freq: Every morning | ORAL | Status: DC
  Administered 2023-03-01: 15 mg via ORAL
  Filled 2023-02-28: qty 1

## 2023-02-28 MED ORDER — ONDANSETRON HCL 4 MG/2ML IJ SOLN: 4.0000 mg | Freq: Four times a day (QID) | INTRAMUSCULAR | Status: DC | PRN

## 2023-02-28 MED ORDER — DEXAMETHASONE SODIUM PHOSPHATE 10 MG/ML IJ SOLN
INTRAMUSCULAR | Status: DC | PRN
  Administered 2023-02-28: 10 mg via INTRAVENOUS

## 2023-02-28 MED ORDER — BUPIVACAINE-EPINEPHRINE 0.25% -1:200000 IJ SOLN
INTRAMUSCULAR | Status: AC
  Filled 2023-02-28: qty 1

## 2023-02-28 MED ORDER — BUPIVACAINE LIPOSOME 1.3 % IJ SUSP
INTRAMUSCULAR | Status: DC | PRN
Start: 2023-02-28 — End: 2023-02-28
  Administered 2023-02-28: 20 mL

## 2023-02-28 MED ORDER — DOCUSATE SODIUM 100 MG PO CAPS
100.0000 mg | ORAL_CAPSULE | Freq: Two times a day (BID) | ORAL | Status: DC
  Administered 2023-03-01: 100 mg via ORAL
  Filled 2023-02-28: qty 1

## 2023-02-28 MED ORDER — PROPOFOL 10 MG/ML IV BOLUS
INTRAVENOUS | Status: DC | PRN
  Administered 2023-02-28: 150 mg via INTRAVENOUS

## 2023-02-28 MED ORDER — PROPOFOL 10 MG/ML IV BOLUS
INTRAVENOUS | Status: AC
  Filled 2023-02-28: qty 20

## 2023-02-28 MED ORDER — PROCHLORPERAZINE EDISYLATE 10 MG/2ML IJ SOLN
10.0000 mg | Freq: Four times a day (QID) | INTRAMUSCULAR | Status: DC | PRN
  Administered 2023-02-28: 10 mg via INTRAVENOUS
  Filled 2023-02-28: qty 2

## 2023-02-28 MED ORDER — KCL IN DEXTROSE-NACL 20-5-0.45 MEQ/L-%-% IV SOLN
INTRAVENOUS | Status: DC
  Filled 2023-02-28: qty 1000

## 2023-02-28 MED ORDER — LACTATED RINGERS IV SOLN: INTRAVENOUS | Status: DC

## 2023-02-28 MED ORDER — AMISULPRIDE (ANTIEMETIC) 5 MG/2ML IV SOLN: 10.0000 mg | Freq: Once | INTRAVENOUS | Status: DC | PRN

## 2023-02-28 MED ORDER — ORAL CARE MOUTH RINSE: 15.0000 mL | Freq: Once | OROMUCOSAL | Status: AC

## 2023-02-28 MED ORDER — LIDOCAINE HCL (CARDIAC) PF 100 MG/5ML IV SOSY
PREFILLED_SYRINGE | INTRAVENOUS | Status: DC | PRN
  Administered 2023-02-28: 80 mg via INTRAVENOUS

## 2023-02-28 MED ORDER — HYDROMORPHONE HCL 1 MG/ML IJ SOLN
INTRAMUSCULAR | Status: AC
  Filled 2023-02-28: qty 1

## 2023-02-28 MED ORDER — HYDROMORPHONE HCL 1 MG/ML IJ SOLN
0.2500 mg | INTRAMUSCULAR | Status: DC | PRN
  Administered 2023-02-28 (×3): 0.5 mg via INTRAVENOUS

## 2023-02-28 MED ORDER — LORAZEPAM 0.5 MG PO TABS: 0.5000 mg | ORAL_TABLET | Freq: Two times a day (BID) | ORAL | Status: DC | PRN

## 2023-02-28 MED ORDER — METHOCARBAMOL 500 MG PO TABS: 500.0000 mg | ORAL_TABLET | Freq: Four times a day (QID) | ORAL | Status: DC | PRN

## 2023-02-28 MED ORDER — DIPHENHYDRAMINE HCL 12.5 MG/5ML PO ELIX: 12.5000 mg | ORAL_SOLUTION | Freq: Four times a day (QID) | ORAL | Status: DC | PRN

## 2023-02-28 MED ORDER — CHLORHEXIDINE GLUCONATE 0.12 % MT SOLN
15.0000 mL | Freq: Once | OROMUCOSAL | Status: AC
  Administered 2023-02-28: 15 mL via OROMUCOSAL

## 2023-02-28 MED ORDER — BUPIVACAINE LIPOSOME 1.3 % IJ SUSP
INTRAMUSCULAR | Status: AC
  Filled 2023-02-28: qty 20

## 2023-02-28 MED ORDER — ONDANSETRON HCL 4 MG/2ML IJ SOLN
INTRAMUSCULAR | Status: AC
  Administered 2023-02-28: 4 mg
  Filled 2023-02-28: qty 2

## 2023-02-28 MED ORDER — MORPHINE SULFATE (PF) 2 MG/ML IV SOLN
1.0000 mg | INTRAVENOUS | Status: DC | PRN
  Administered 2023-02-28: 2 mg via INTRAVENOUS
  Filled 2023-02-28: qty 1

## 2023-02-28 MED ORDER — SIMETHICONE 80 MG PO CHEW: 40.0000 mg | CHEWABLE_TABLET | Freq: Four times a day (QID) | ORAL | Status: DC | PRN

## 2023-02-28 MED ORDER — LACTATED RINGERS IV SOLN: INTRAVENOUS | Status: DC | PRN

## 2023-02-28 MED ORDER — ACETAMINOPHEN 500 MG PO TABS
1000.0000 mg | ORAL_TABLET | Freq: Four times a day (QID) | ORAL | Status: DC
  Administered 2023-02-28 – 2023-03-01 (×3): 1000 mg via ORAL
  Filled 2023-02-28 (×3): qty 2

## 2023-02-28 MED ORDER — ONDANSETRON HCL 4 MG/2ML IJ SOLN
INTRAMUSCULAR | Status: DC | PRN
  Administered 2023-02-28: 4 mg via INTRAVENOUS

## 2023-02-28 SURGICAL SUPPLY — 47 items
ADH SKN CLS APL DERMABOND .7 (GAUZE/BANDAGES/DRESSINGS) ×1
APL PRP STRL LF DISP 70% ISPRP (MISCELLANEOUS) ×1
APL SKNCLS STERI-STRIP NONHPOA (GAUZE/BANDAGES/DRESSINGS)
BAG COUNTER SPONGE SURGICOUNT (BAG) IMPLANT
BAG SPNG CNTER NS LX DISP (BAG)
BENZOIN TINCTURE PRP APPL 2/3 (GAUZE/BANDAGES/DRESSINGS) IMPLANT
BINDER ABDOMINAL 12 ML 46-62 (SOFTGOODS) IMPLANT
CABLE HIGH FREQUENCY MONO STRZ (ELECTRODE) IMPLANT
CHLORAPREP W/TINT 26 (MISCELLANEOUS) ×2 IMPLANT
COVER SURGICAL LIGHT HANDLE (MISCELLANEOUS) ×2 IMPLANT
DERMABOND ADVANCED .7 DNX12 (GAUZE/BANDAGES/DRESSINGS) IMPLANT
DEVICE SECURE STRAP 25 ABSORB (INSTRUMENTS) IMPLANT
DEVICE TROCAR PUNCTURE CLOSURE (ENDOMECHANICALS) ×2 IMPLANT
DRAIN CHANNEL 19F RND (DRAIN) IMPLANT
ELECT PENCIL ROCKER SW 15FT (MISCELLANEOUS) IMPLANT
EVACUATOR SILICONE 100CC (DRAIN) IMPLANT
GLOVE BIO SURGEON STRL SZ7.5 (GLOVE) ×2 IMPLANT
GLOVE INDICATOR 8.0 STRL GRN (GLOVE) ×2 IMPLANT
GOWN STRL REUS W/ TWL XL LVL3 (GOWN DISPOSABLE) ×6 IMPLANT
GOWN STRL REUS W/TWL XL LVL3 (GOWN DISPOSABLE) ×3
IRRIG SUCT STRYKERFLOW 2 WTIP (MISCELLANEOUS)
IRRIGATION SUCT STRKRFLW 2 WTP (MISCELLANEOUS) IMPLANT
KIT BASIN OR (CUSTOM PROCEDURE TRAY) ×2 IMPLANT
KIT TURNOVER KIT A (KITS) IMPLANT
L-HOOK LAP DISP 36CM (ELECTROSURGICAL)
LHOOK LAP DISP 36CM (ELECTROSURGICAL) IMPLANT
MARKER SKIN DUAL TIP RULER LAB (MISCELLANEOUS) ×2 IMPLANT
MESH VENTRALIGHT ST 4.5IN (Mesh General) IMPLANT
NDL SPNL 22GX3.5 QUINCKE BK (NEEDLE) ×2 IMPLANT
NEEDLE SPNL 22GX3.5 QUINCKE BK (NEEDLE) ×1
SCISSORS LAP 5X35 DISP (ENDOMECHANICALS) IMPLANT
SET TUBE SMOKE EVAC HIGH FLOW (TUBING) ×2 IMPLANT
SHEARS HARMONIC 36 ACE (MISCELLANEOUS) IMPLANT
SLEEVE ADV FIXATION 5X100MM (TROCAR) IMPLANT
SPIKE FLUID TRANSFER (MISCELLANEOUS) ×2 IMPLANT
STRIP CLOSURE SKIN 1/2X4 (GAUZE/BANDAGES/DRESSINGS) IMPLANT
SUT ETHILON 2 0 PS N (SUTURE) IMPLANT
SUT MNCRL AB 4-0 PS2 18 (SUTURE) ×2 IMPLANT
SUT NOVA 1 T20/GS 25DT (SUTURE) IMPLANT
SUT VIC AB 3-0 SH 18 (SUTURE) IMPLANT
TOWEL OR 17X26 10 PK STRL BLUE (TOWEL DISPOSABLE) ×2 IMPLANT
TRAY FOLEY MTR SLVR 14FR STAT (SET/KITS/TRAYS/PACK) IMPLANT
TRAY FOLEY MTR SLVR 16FR STAT (SET/KITS/TRAYS/PACK) IMPLANT
TRAY LAPAROSCOPIC (CUSTOM PROCEDURE TRAY) ×2 IMPLANT
TROCAR ADV FIXATION 12X100MM (TROCAR) IMPLANT
TROCAR ADV FIXATION 5X100MM (TROCAR) ×2 IMPLANT
TROCAR XCEL NON-BLD 5MMX100MML (ENDOMECHANICALS) ×2 IMPLANT

## 2023-02-28 NOTE — H&P (Signed)
CC: here for surgery  Requesting provider: n/a  HPI: Ann Turner is an 77 y.o. female who is here for laparoscopic assisted repair of incarcerated ventral hernia with mesh.  SHe denies any changes since I saw her in the clinic in early August.  She had a CT scan since our saw her in the office which evaluated her abdominal wall anatomy.   Old hpi: She states that she has noticed a bulge around her umbilicus and above the umbilicus for a few months now. It causes discomfort. The bulge is generally there but can change a little bit in size at times. No nausea or vomiting or constipation. She did have an issue with diarrhea that took about 5 weeks to resolve but she thinks it was due to food poisoning. She has had an open cholecystectomy as well as a hysterectomy and appendectomy through a Pfannenstiel incision back in the 1980s. No chest pain or chest pressure or shortness of breath. No systemic blood thinners. Does take medicine for blood pressure and cholesterol. Takes Celebrex for her right knee.  Past Medical History:  Diagnosis Date   Anemia    Anxiety    Arthritis    Chronic kidney disease    Hypercholesteremia    Hyperlipidemia    Hypertension    Palpitations    30 years ago   Plantar fasciitis     Past Surgical History:  Procedure Laterality Date   ABDOMINAL HYSTERECTOMY     APPENDECTOMY     CATARACT EXTRACTION, BILATERAL     CHOLECYSTECTOMY     CHOLECYSTECTOMY     COLONOSCOPY     KNEE ARTHROSCOPY W/ MENISCECTOMY Bilateral    NASAL SEPTUM SURGERY     ROBOTIC ASSISTED SUPRACERVICAL HYSTERECTOMY WITH BILATERAL SALPINGO OOPHERECTOMY     ROTATOR CUFF REPAIR Right    TONSILLECTOMY      Family History  Problem Relation Age of Onset   Hypertension Mother    Hypercholesterolemia Mother    CVA Mother 76   Hypercholesterolemia Father    COPD Father    Non-Hodgkin's lymphoma Sister    Breast cancer Sister 73   CAD Maternal Grandmother 37   Hypertension Maternal  Grandmother    Coronary artery disease Maternal Grandmother    CAD Maternal Grandfather 83   Hypercholesterolemia Maternal Grandfather    Coronary artery disease Maternal Grandfather    COPD Paternal Grandmother     Social:  reports that she has never smoked. She has never used smokeless tobacco. She reports that she does not drink alcohol and does not use drugs.  Allergies:  Allergies  Allergen Reactions   Pravastatin Sodium     myalgias   Simvastatin     myalgias   Zoloft [Sertraline Hcl] Other (See Comments)    Insomnia (difficulty sleeping)    Medications: I have reviewed the patient's current medications.   ROS - all of the below systems have been reviewed with the patient and positives are indicated with bold text General: chills, fever or night sweats Eyes: blurry vision or double vision ENT: epistaxis or sore throat Allergy/Immunology: itchy/watery eyes or nasal congestion Hematologic/Lymphatic: bleeding problems, blood clots or swollen lymph nodes Endocrine: temperature intolerance or unexpected weight changes Breast: new or changing breast lumps or nipple discharge Resp: cough, shortness of breath, or wheezing CV: chest pain or dyspnea on exertion GI: as per HPI GU: dysuria, trouble voiding, or hematuria MSK: joint pain or joint stiffness Neuro: TIA or stroke symptoms Derm: pruritus and  skin lesion changes Psych: anxiety and depression  PE There were no vitals taken for this visit. Constitutional: NAD; conversant; no deformities Eyes: Moist conjunctiva; no lid lag; anicteric; PERRL Neck: Trachea midline; no thyromegaly Lungs: Normal respiratory effort; no tactile fremitus CV: RRR; no palpable thrills; no pitting edema GI: Abd soft, nontender, supraumbilical bulge 3-1/2 x 3-1/2 cm; no palpable hepatosplenomegaly; old right subcostal incision MSK: normal gait; no clubbing/cyanosis Psychiatric: Appropriate affect; alert and oriented x3 Lymphatic: No palpable  cervical or axillary lymphadenopathy Skin: No rash, lesions or jaundice  No results found for this or any previous visit (from the past 48 hour(s)).  No results found.  Imaging: Ventral abdominal wall hernia containing omentum  A/P: Ann Turner is an 77 y.o. female with incarcerated supraumbilical ventral hernia  To operating room later for laparoscopic assisted repair of incarcerated supraumbilical ventral hernia with mesh IV antibiotic Enhanced recovery protocol All questions asked and answered  Mary Sella. Andrey Campanile, MD, FACS General, Bariatric, & Minimally Invasive Surgery [facility]

## 2023-02-28 NOTE — Anesthesia Procedure Notes (Signed)
Procedure Name: Intubation Date/Time: 02/28/2023 2:27 PM  Performed by: Laurita Quint, CRNAPre-anesthesia Checklist: Patient identified, Emergency Drugs available, Suction available and Patient being monitored Patient Re-evaluated:Patient Re-evaluated prior to induction Oxygen Delivery Method: Circle System Utilized Preoxygenation: Pre-oxygenation with 100% oxygen Induction Type: IV induction Ventilation: Mask ventilation without difficulty Laryngoscope Size: Miller and 2 Grade View: Grade I Tube type: Oral Tube size: 7.0 mm Number of attempts: 1 Airway Equipment and Method: Stylet and Oral airway Placement Confirmation: ETT inserted through vocal cords under direct vision, positive ETCO2 and breath sounds checked- equal and bilateral Secured at: 21 cm Tube secured with: Tape Dental Injury: Teeth and Oropharynx as per pre-operative assessment

## 2023-02-28 NOTE — Transfer of Care (Signed)
Immediate Anesthesia Transfer of Care Note  Patient: Ann Turner  Procedure(s) Performed: LAPAROSCOPIC REPAIR INCARCERATED VENTRAL HERNIA with mesh lysis of adhesions  Patient Location: PACU  Anesthesia Type:General  Level of Consciousness: drowsy  Airway & Oxygen Therapy: Patient Spontanous Breathing and Patient connected to face mask oxygen  Post-op Assessment: Report given to RN and Post -op Vital signs reviewed and stable  Post vital signs: Reviewed and stable  Last Vitals:  Vitals Value Taken Time  BP 154/70 02/28/23 1602  Temp    Pulse 87 02/28/23 1603  Resp 15 02/28/23 1603  SpO2 97 % 02/28/23 1603  Vitals shown include unfiled device data.  Last Pain:  Vitals:   02/28/23 1341  TempSrc:   PainSc: 0-No pain         Complications: No notable events documented.

## 2023-02-28 NOTE — Anesthesia Postprocedure Evaluation (Signed)
Anesthesia Post Note  Patient: Ann Turner  Procedure(s) Performed: LAPAROSCOPIC REPAIR INCARCERATED VENTRAL HERNIA with mesh lysis of adhesions     Patient location during evaluation: PACU Anesthesia Type: General Level of consciousness: awake and alert, oriented and patient cooperative Pain management: pain level controlled Vital Signs Assessment: post-procedure vital signs reviewed and stable Respiratory status: spontaneous breathing, nonlabored ventilation and respiratory function stable Cardiovascular status: blood pressure returned to baseline and stable Postop Assessment: no apparent nausea or vomiting Anesthetic complications: no  No notable events documented.  Last Vitals:  Vitals:   02/28/23 1642 02/28/23 1645  BP:  (!) 164/91  Pulse: 77 76  Resp: 12 12  Temp:    SpO2: 99% 99%    Last Pain:  Vitals:   02/28/23 1645  TempSrc:   PainSc: 3                  Lannie Fields

## 2023-02-28 NOTE — Anesthesia Preprocedure Evaluation (Addendum)
Anesthesia Evaluation    Reviewed: Allergy & Precautions, Patient's Chart, lab work & pertinent test results  Airway Mallampati: II  TM Distance: >3 FB Neck ROM: Full    Dental no notable dental hx.    Pulmonary neg pulmonary ROS   Pulmonary exam normal breath sounds clear to auscultation       Cardiovascular hypertension, Pt. on medications Normal cardiovascular exam Rhythm:Regular Rate:Normal     Neuro/Psych  PSYCHIATRIC DISORDERS Anxiety     negative neurological ROS     GI/Hepatic negative GI ROS, Neg liver ROS,,,  Endo/Other  Obesity BMI 33  Renal/GU Renal InsufficiencyRenal disease  negative genitourinary   Musculoskeletal  (+) Arthritis , Osteoarthritis,    Abdominal  (+) + obese  Peds  Hematology negative hematology ROS (+) Hb 13.3   Anesthesia Other Findings   Reproductive/Obstetrics negative OB ROS                             Anesthesia Physical Anesthesia Plan  ASA: 2  Anesthesia Plan: General   Post-op Pain Management: Tylenol PO (pre-op)*   Induction: Intravenous  PONV Risk Score and Plan: 3 and Dexamethasone, Ondansetron, Treatment may vary due to age or medical condition and Midazolam  Airway Management Planned: Oral ETT  Additional Equipment: None  Intra-op Plan:   Post-operative Plan: Extubation in OR  Informed Consent: I have reviewed the patients History and Physical, chart, labs and discussed the procedure including the risks, benefits and alternatives for the proposed anesthesia with the patient or authorized representative who has indicated his/her understanding and acceptance.     Dental advisory given  Plan Discussed with: CRNA  Anesthesia Plan Comments:         Anesthesia Quick Evaluation

## 2023-02-28 NOTE — Discharge Instructions (Signed)
LAPAROSCOPIC SURGERY: POST OP INSTRUCTIONS Always review your discharge instruction sheet given to you by the facility where your surgery was performed. IF YOU HAVE DISABILITY OR FAMILY LEAVE FORMS, YOU MUST BRING THEM TO THE OFFICE FOR PROCESSING.   DO NOT GIVE THEM TO YOUR DOCTOR.  PAIN CONTROL  First take acetaminophen (Tylenol) AND/or ibuprofen (Advil) to control your pain after surgery.  Follow directions on package.  Taking acetaminophen (Tylenol) and/or ibuprofen (Advil) regularly after surgery will help to control your pain and lower the amount of prescription pain medication you may need.  You should not take more than 3,000 mg (3 grams) of acetaminophen (Tylenol) in 24 hours.  You should not take ibuprofen (Advil), aleve, motrin, naprosyn or other NSAIDS if you have a history of stomach ulcers or chronic kidney disease.  A prescription for pain medication may be given to you upon discharge.  Take your pain medication as prescribed, if you still have uncontrolled pain after taking acetaminophen (Tylenol) or ibuprofen (Advil). Use ice packs to help control pain. If you need a refill on your pain medication, please contact your pharmacy.  They will contact our office to request authorization. Prescriptions will not be filled after 5pm or on week-ends.  HOME MEDICATIONS Take your usually prescribed medications unless otherwise directed.  DIET You should follow a light diet the first few days after arrival home.  Be sure to include lots of fluids daily. Avoid fatty, fried foods.   CONSTIPATION It is common to experience some constipation after surgery and if you are taking pain medication.  Increasing fluid intake and taking a stool softener (such as Colace) will usually help or prevent this problem from occurring.  A mild laxative (Milk of Magnesia or Miralax) should be taken according to package instructions if there are no bowel movements after 48 hours.  WOUND/INCISION CARE Most  patients will experience some swelling and bruising in the area of the incisions.  Ice packs will help.  Swelling and bruising can take several days to resolve.  Unless discharge instructions indicate otherwise, follow guidelines below  STERI-STRIPS - you may remove your outer bandages 48 hours after surgery, and you may shower at that time.  You have steri-strips (small skin tapes) in place directly over the incision.  These strips should be left on the skin for 7-10 days.   DERMABOND/SKIN GLUE - you may shower in 24 hours.  The glue will flake off over the next 2-3 weeks. Any sutures or staples will be removed at the office during your follow-up visit.  ACTIVITIES You may resume regular (light) daily activities beginning the next day--such as daily self-care, walking, climbing stairs--gradually increasing activities as tolerated.  You may have sexual intercourse when it is comfortable.  Refrain from any heavy lifting or straining until approved by your doctor. You may drive when you are no longer taking prescription pain medication, you can comfortably wear a seatbelt, and you can safely maneuver your car and apply brakes.  FOLLOW-UP You should see your doctor in the office for a follow-up appointment approximately 2-3 weeks after your surgery.  You should have been given your post-op/follow-up appointment when your surgery was scheduled.  If you did not receive a post-op/follow-up appointment, make sure that you call for this appointment within a day or two after you arrive home to insure a convenient appointment time.  OTHER INSTRUCTIONS Wear abdominal binder during day as needed for pain control  WHEN TO CALL YOUR DOCTOR: Fever over 101.0  Inability to urinate Continued bleeding from incision. Increased pain, redness, or drainage from the incision. Increasing abdominal pain  The clinic staff is available to answer your questions during regular business hours.  Please don't hesitate to call  and ask to speak to one of the nurses for clinical concerns.  If you have a medical emergency, go to the nearest emergency room or call 911.  A surgeon from [facility] is always on call at the hospital. [address], [address], [place], [place]  [address] ? [address] [address], [place], [place]   [address] [phone] ? [phone] ? FAX [phone] Web site: [email or web address]

## 2023-02-28 NOTE — Plan of Care (Signed)
  Problem: Education: Goal: Understanding of post-operative needs will improve Outcome: Progressing Goal: Individualized Educational Video(s) Outcome: Progressing   Problem: Clinical Measurements: Goal: Postoperative complications will be avoided or minimized Outcome: Progressing

## 2023-02-28 NOTE — Op Note (Addendum)
Ann Turner [id number] 1945-05-31 02/28/2023   Laparoscopic Assisted Repair of Incarcerated Ventral  Hernia & umbilical hernia (4x 3.5 cm) with laparoscopic lysis of adhesions for 20 minutes with Mesh Procedure Note; laparoscopic bilateral tap block  Indications: Symptomatic incarcerated ventral hernia & small umbilical hernia.  Patient had had a previous open cholecystectomy through a right subcostal incision.  Pre-operative Diagnosis: incarcerated ventral hernia-(with omentum) and a small umbilical fascial hernia ( total hernia 4 x 3-1/2 cm)  Post-operative Diagnosis: same + abdominal wall adhesions (incarcerated with omentum)  Surgeon: Gaynelle Adu MD FACS  Assistants: none  Anesthesia: General endotracheal anesthesia   Procedure Details  The patient was seen in the Holding Room. The risks, benefits, complications, treatment options, and expected outcomes were discussed with the patient. The possibilities of reaction to medication, pulmonary aspiration, perforation of viscus, bleeding, recurrent infection, the need for additional procedures, failure to diagnose a condition, and creating a complication requiring transfusion or operation were discussed with the patient. The patient concurred with the proposed plan, giving informed consent.  The site of surgery properly noted/marked. The patient was taken to the operating room, identified as Ann Turner  and the procedure verified as laparoscopic assisted repair of incarcerated ventral hernia with mesh. A Time Out was held and the above information confirmed.    The patient was placed supine.  After establishing general anesthesia, Left arm tucked with the appropriate padding.  The abdomen was prepped with Chloraprep and draped in standard fashion.  A 5 mm Optiview was used the cannulate the peritoneal cavity in the left upper quadrant below the costal margin.  Pneumoperitoneum was obtained by insufflating CO2, maintaining a maximum  pressure of 15 mmHg.  The 5 mm 30-degree laparoscopic was inserted.  There were  significant omental adhesions to the anterior abdominal wall in and around the hernia defect.   Another 5-mm port was placed in the left lateral abdominal wall.  An additional 5 mm trocar was placed in the left lower quadrant in order to facilitate lysis of adhesions.  EndoShears along with harmonic scalpel was used to clear the omental adhesions from the anterior abdominal wall.  The adhesions extended into the lower midline and to the right upper quadrant.  They also were above the hernia defect in the upper midline. There was a plug of omentum incarcerated into the ventral hernia.  Using traction with an atraumatic grasper I was able to reduce the incarcerated plug of omentum.  There was a little bit of bleeding from some sections of the omentum.  This was taken care of with a harmonic scalpel.  I then took down the falciform ligament with Endo Shears with electrocautery.   We visualized 2 fascial defects -the larger ventral hernia in the upper midline and a small umbilical fascial defect.  These were very close to 1 another with only about 3 or 4 mm of intact fascia between the 2.   I then proceeded with the open portion of the procedure.  A vertical supraumbilical incision was created. Dissection was carried down to the hernia sac located above the fascia and was mobilized from surrounding structures. Intact fascia was identified circumferentially around the defect.  The hernia sac was excised and discarded.  Skin and soft tissue was mobilized from the surface of the fascia in a circumferential manner. .  With adding a 5 cm overlap circumferentially this gave [place] a mesh requirement of a round 12 cm.  Therefore I selected a round piece  of Bard ventral light ST mesh measuring 11.4 cm We placed 3 stay sutures of 1-0 Novofil -1 in the center of the mesh, 1 at the apex of the mesh and 1 at the inferior aspect of the mesh..  The mesh  was then rolled up and inserted through the fascial defect. The fascia was then closed primarily over the mesh with 6 interrupted 1-0-novafil sutures.   I then returned laparoscopically.  I then infiltrated Exparel in a regional fashion in the preperitoneal space around the umbilical fascial closure and around the expected locations of where the transfascial sutures would be placed and along bilateral lateral abdominal walls as a laparoscopic tap block. The mesh was then unrolled.  The stay sutures were then pulled up through small stab incisions using the Endo-close device.  This deployed the mesh widely over the closed fascial defects.  The stay sutures were then tied down.  The Secure Strap device was then used to tack down the edges of the mesh at 1 cm intervals circumferentially. We placed a few tacks inside the outer ring of tacks.  We inspected for hemostasis.   Pneumoperitoneum was then released as we removed the remainder of the trocars.     The port sites and umbilical incision were closed with 4-0 Monocryl.  All of the incisions and stay suture sites were then covered with Dermabond.  An abdominal binder was placed around the patient's abdomen.  Foley removed. The patient was extubated and brought to the recovery room in stable condition.  All sponge, instrument, and needle counts were correct prior to closure and at the conclusion of the case.   Findings:  Type of repair - primary suture with mesh underlay Name of mesh - Bard VentralightST  Size of mesh - Round 4.5 in (11.4cm)  Mesh overlap - >5 cm  Placement of mesh - beneath fascia and into peritoneal cavity,  Estimated Blood Loss:  Minimal         Complications:  None; patient tolerated the procedure well.         Disposition: PACU - hemodynamically stable.         Condition: stable

## 2023-02-28 NOTE — Plan of Care (Signed)
[facility] Tonsillectomy/Adenoidectomy, Postoperative PEDS care plan entered in error.

## 2023-03-01 ENCOUNTER — Other Ambulatory Visit (HOSPITAL_COMMUNITY): Payer: Self-pay

## 2023-03-01 DIAGNOSIS — N189 Chronic kidney disease, unspecified: Secondary | ICD-10-CM | POA: Diagnosis not present

## 2023-03-01 DIAGNOSIS — K429 Umbilical hernia without obstruction or gangrene: Secondary | ICD-10-CM | POA: Diagnosis not present

## 2023-03-01 DIAGNOSIS — K436 Other and unspecified ventral hernia with obstruction, without gangrene: Secondary | ICD-10-CM | POA: Diagnosis not present

## 2023-03-01 DIAGNOSIS — I129 Hypertensive chronic kidney disease with stage 1 through stage 4 chronic kidney disease, or unspecified chronic kidney disease: Secondary | ICD-10-CM | POA: Diagnosis not present

## 2023-03-01 DIAGNOSIS — K420 Umbilical hernia with obstruction, without gangrene: Secondary | ICD-10-CM | POA: Diagnosis not present

## 2023-03-01 LAB — BASIC METABOLIC PANEL
Anion gap: 8 (ref 5–15)
BUN: 23 mg/dL (ref 8–23)
CO2: 24 mmol/L (ref 22–32)
Calcium: 8.8 mg/dL — ABNORMAL LOW (ref 8.9–10.3)
Chloride: 103 mmol/L (ref 98–111)
Creatinine, Ser: 0.89 mg/dL (ref 0.44–1.00)
GFR, Estimated: >60 mL/min (ref >60)
Glucose, Bld: 144 mg/dL — ABNORMAL HIGH (ref 70–99)
Potassium: 3.9 mmol/L (ref 3.5–5.1)
Sodium: 135 mmol/L (ref 135–145)

## 2023-03-01 MED ORDER — ONDANSETRON 4 MG PO TBDP
4.0000 mg | ORAL_TABLET | Freq: Four times a day (QID) | ORAL | 0 refills | Status: AC | PRN
  Filled 2023-03-01: qty 20, 5d supply, fill #0

## 2023-03-01 MED ORDER — DOCUSATE SODIUM 100 MG PO CAPS
100.0000 mg | ORAL_CAPSULE | Freq: Two times a day (BID) | ORAL | 0 refills | Status: AC
  Filled 2023-03-01: qty 10, 5d supply, fill #0

## 2023-03-01 MED ORDER — ACETAMINOPHEN 500 MG PO TABS
1000.0000 mg | ORAL_TABLET | Freq: Four times a day (QID) | ORAL | 0 refills | Status: AC
  Filled 2023-03-01: qty 30, 4d supply, fill #0

## 2023-03-01 MED ORDER — OXYCODONE HCL 5 MG PO TABS
5.0000 mg | ORAL_TABLET | ORAL | 0 refills | Status: AC | PRN
  Filled 2023-03-01: qty 50, 5d supply, fill #0

## 2023-03-01 MED ORDER — TIZANIDINE HCL 2 MG PO TABS
2.0000 mg | ORAL_TABLET | Freq: Three times a day (TID) | ORAL | 0 refills | Status: AC | PRN
  Filled 2023-03-01: qty 30, 10d supply, fill #0

## 2023-03-01 NOTE — Plan of Care (Signed)
  Problem: Activity: Goal: Risk for activity intolerance will decrease Outcome: Progressing   Problem: Nutrition: Goal: Adequate nutrition will be maintained Outcome: Progressing   Problem: Elimination: Goal: Will not experience complications related to urinary retention Outcome: Adequate for Discharge

## 2023-03-01 NOTE — Progress Notes (Addendum)
AVS reviewed w/ pt who verbalized an understanding, no other questions at this time - PIV x 1 removed as documented. Discharge meds delivered to pt from [facility] pharmacy by this RN. Pt dressing for home. Pt took 1 tab of zofran - home use at 1030. Pt to main entrance via w/c

## 2023-03-01 NOTE — Discharge Summary (Signed)
Physician Discharge Summary  Patient ID: Ann Turner MRN: [id number] DOB/AGE: 1946/01/23 77 y.o.  Admit date: 02/28/2023 Discharge date: 03/01/2023  Admission Diagnoses: Incarcerated umbilical hernia  Discharge Diagnoses:  Principal Problem:   S/P repair of ventral hernia   Discharged Condition: stable  Hospital Course: pt was admitted to the floor following laparoscopic lysis of adhesions and lap repair of incarcerated umbilical hernia with mesh 02/28/23 by Dr. Andrey Campanile. Pt did well overnight. She did require consistent pain medication, but did well with oral meds after initially requiring IV meds.  She was ambulatory and urinating.  She denied n/v.  She was discharged in stable condition.   Consults: None  Significant Diagnostic Studies: labs: BMET ok POD 1  Treatments: surgery: see above  Discharge Exam: Blood pressure (!) 134/56, pulse 77, temperature 98.5 F (36.9 C), temperature source Oral, resp. rate 18, height 5' 3.5" (1.613 m), weight 83.9 kg, SpO2 92%. General appearance: alert, cooperative, and no distress Resp: breathign comfortably GI: soft, non distended, approp tender at incisions only. Faint bruising at umbilical incision.  Abd binder in place Extremities: extremities normal, atraumatic, no cyanosis or edema  Disposition: Discharge disposition: 01-Home or Self Care       Discharge Instructions     Call MD for:  difficulty breathing, headache or visual disturbances   Complete by: As directed    Call MD for:  hives   Complete by: As directed    Call MD for:  persistant nausea and vomiting   Complete by: As directed    Call MD for:  redness, tenderness, or signs of infection (pain, swelling, redness, odor or green/yellow discharge around incision site)   Complete by: As directed    Call MD for:  severe uncontrolled pain   Complete by: As directed    Call MD for:  temperature >100.4   Complete by: As directed    Diet - low sodium heart healthy    Complete by: As directed    Increase activity slowly   Complete by: As directed       Allergies as of 03/01/2023       Reactions   Pravastatin Sodium    myalgias   Simvastatin    myalgias   Zoloft [sertraline Hcl] Other (See Comments)   Insomnia (difficulty sleeping)        Medication List     TAKE these medications    acetaminophen 500 MG tablet Commonly known as: TYLENOL Take 2 tablets (1,000 mg total) by mouth every 6 (six) hours.   aspirin EC 81 MG tablet Take 81 mg by mouth daily.   atorvastatin 10 MG tablet Commonly known as: LIPITOR Take 10 mg by mouth in the morning.   celecoxib 100 MG capsule Commonly known as: CELEBREX Take 100 mg by mouth in the morning.   CHOLECALCIFEROL PO Take 1 capsule by mouth in the morning.   docusate sodium 100 MG capsule Commonly known as: COLACE Take 1 capsule (100 mg total) by mouth 2 (two) times daily.   fexofenadine 180 MG tablet Commonly known as: ALLEGRA Take 180 mg by mouth in the morning.   lisinopril 10 MG tablet Commonly known as: ZESTRIL Take 15 mg by mouth in the morning.   LORazepam 0.5 MG tablet Commonly known as: ATIVAN Take 0.5-1 mg by mouth 2 (two) times daily as needed for anxiety.   multivitamin with minerals Tabs tablet Take 1 tablet by mouth in the morning.   omeprazole 20 MG capsule Commonly  known as: PRILOSEC Take 20 mg by mouth in the morning.   ondansetron 4 MG disintegrating tablet Commonly known as: ZOFRAN-ODT Take 1 tablet (4 mg total) by mouth every 6 (six) hours as needed for nausea.   oxyCODONE 5 MG immediate release tablet Commonly known as: Oxy IR/ROXICODONE Take 1-2 tablets (5-10 mg total) by mouth every 4 (four) hours as needed for moderate pain (pain score 4-6).   simethicone 80 MG chewable tablet Commonly known as: MYLICON Chew 80-160 mg by mouth every 6 (six) hours as needed for flatulence.   tiZANidine 2 MG tablet Commonly known as: ZANAFLEX Take 1 tablet (2 mg  total) by mouth every 8 (eight) hours as needed for muscle spasms.   triamcinolone cream 0.1 % Commonly known as: KENALOG Apply 1 Application topically daily as needed (skin rash/irritation.).        Follow-up Information     Gaynelle Adu, MD. Schedule an appointment as soon as possible for a visit in 3 week(s).   Specialty: General Surgery Why: For wound re-check Contact information: [address] [place] [address] [place] [address] [phone]                 Signed: Almond Lint 03/01/2023, 7:59 AM

## 2023-03-03 ENCOUNTER — Encounter (HOSPITAL_COMMUNITY): Payer: Self-pay | Admitting: General Surgery

## 2023-03-19 DIAGNOSIS — Z8719 Personal history of other diseases of the digestive system: Secondary | ICD-10-CM | POA: Diagnosis not present

## 2023-03-19 DIAGNOSIS — Z9889 Other specified postprocedural states: Secondary | ICD-10-CM | POA: Diagnosis not present

## 2023-04-25 ENCOUNTER — Other Ambulatory Visit (HOSPITAL_COMMUNITY): Payer: Self-pay

## 2023-06-16 DIAGNOSIS — I10 Essential (primary) hypertension: Secondary | ICD-10-CM | POA: Diagnosis not present

## 2023-06-16 DIAGNOSIS — E785 Hyperlipidemia, unspecified: Secondary | ICD-10-CM | POA: Diagnosis not present

## 2023-06-16 DIAGNOSIS — Z791 Long term (current) use of non-steroidal anti-inflammatories (NSAID): Secondary | ICD-10-CM | POA: Diagnosis not present

## 2023-06-16 DIAGNOSIS — F4010 Social phobia, unspecified: Secondary | ICD-10-CM | POA: Diagnosis not present

## 2023-06-16 DIAGNOSIS — E669 Obesity, unspecified: Secondary | ICD-10-CM | POA: Diagnosis not present

## 2023-06-16 DIAGNOSIS — R21 Rash and other nonspecific skin eruption: Secondary | ICD-10-CM | POA: Diagnosis not present

## 2023-06-16 DIAGNOSIS — L900 Lichen sclerosus et atrophicus: Secondary | ICD-10-CM | POA: Diagnosis not present

## 2023-06-16 DIAGNOSIS — M545 Low back pain, unspecified: Secondary | ICD-10-CM | POA: Diagnosis not present

## 2023-08-07 ENCOUNTER — Ambulatory Visit: Payer: Medicare Other

## 2023-08-07 ENCOUNTER — Ambulatory Visit
Admission: RE | Admit: 2023-08-07 | Discharge: 2023-08-07 | Disposition: A | Discharge status: Home / Self Care | Payer: Medicare Other | Source: Ambulatory Visit | Attending: Family Medicine | Admitting: Family Medicine

## 2023-08-07 DIAGNOSIS — N958 Other specified menopausal and perimenopausal disorders: Secondary | ICD-10-CM | POA: Diagnosis not present

## 2023-08-07 DIAGNOSIS — M8588 Other specified disorders of bone density and structure, other site: Secondary | ICD-10-CM | POA: Diagnosis not present

## 2023-08-07 DIAGNOSIS — Z90722 Acquired absence of ovaries, bilateral: Secondary | ICD-10-CM | POA: Diagnosis not present

## 2023-08-07 DIAGNOSIS — E2839 Other primary ovarian failure: Secondary | ICD-10-CM | POA: Diagnosis not present

## 2023-08-12 ENCOUNTER — Ambulatory Visit
Admission: RE | Admit: 2023-08-12 | Discharge: 2023-08-12 | Disposition: A | Discharge status: Home / Self Care | Source: Ambulatory Visit | Attending: Family Medicine | Admitting: Family Medicine

## 2023-08-12 DIAGNOSIS — Z1231 Encounter for screening mammogram for malignant neoplasm of breast: Secondary | ICD-10-CM

## 2024-01-13 DIAGNOSIS — Z79899 Other long term (current) drug therapy: Secondary | ICD-10-CM | POA: Diagnosis not present

## 2024-01-13 DIAGNOSIS — L900 Lichen sclerosus et atrophicus: Secondary | ICD-10-CM | POA: Diagnosis not present

## 2024-01-13 DIAGNOSIS — E669 Obesity, unspecified: Secondary | ICD-10-CM | POA: Diagnosis not present

## 2024-01-13 DIAGNOSIS — L309 Dermatitis, unspecified: Secondary | ICD-10-CM | POA: Diagnosis not present

## 2024-01-13 DIAGNOSIS — I10 Essential (primary) hypertension: Secondary | ICD-10-CM | POA: Diagnosis not present

## 2024-01-13 DIAGNOSIS — F4010 Social phobia, unspecified: Secondary | ICD-10-CM | POA: Diagnosis not present

## 2024-01-13 DIAGNOSIS — Z23 Encounter for immunization: Secondary | ICD-10-CM | POA: Diagnosis not present

## 2024-01-13 DIAGNOSIS — Z0000 Encounter for general adult medical examination without abnormal findings: Secondary | ICD-10-CM | POA: Diagnosis not present

## 2024-01-13 DIAGNOSIS — M8588 Other specified disorders of bone density and structure, other site: Secondary | ICD-10-CM | POA: Diagnosis not present

## 2024-01-13 DIAGNOSIS — M545 Low back pain, unspecified: Secondary | ICD-10-CM | POA: Diagnosis not present

## 2024-01-13 DIAGNOSIS — E785 Hyperlipidemia, unspecified: Secondary | ICD-10-CM | POA: Diagnosis not present

## 2024-01-13 DIAGNOSIS — K219 Gastro-esophageal reflux disease without esophagitis: Secondary | ICD-10-CM | POA: Diagnosis not present

## 2024-03-23 DIAGNOSIS — M79674 Pain in right toe(s): Secondary | ICD-10-CM | POA: Diagnosis not present

## 2024-03-23 DIAGNOSIS — M79645 Pain in left finger(s): Secondary | ICD-10-CM | POA: Diagnosis not present

## 2024-04-12 DIAGNOSIS — R300 Dysuria: Secondary | ICD-10-CM | POA: Diagnosis not present

## 2024-04-12 DIAGNOSIS — N3000 Acute cystitis without hematuria: Secondary | ICD-10-CM | POA: Diagnosis not present
